# Patient Record
Sex: Female | Born: 1990 | Race: White | Hispanic: No | Marital: Married | Smoking: Never smoker
Health system: Southern US, Community
[De-identification: ages and names within clinical notes are randomized; demographics above are authoritative.]

## PROBLEM LIST (undated history)

## (undated) ENCOUNTER — Inpatient Hospital Stay (HOSPITAL_COMMUNITY): Payer: Self-pay

## (undated) DIAGNOSIS — G43009 Migraine without aura, not intractable, without status migrainosus: Secondary | ICD-10-CM

## (undated) HISTORY — PX: WISDOM TOOTH EXTRACTION: SHX21

## (undated) HISTORY — DX: Migraine without aura, not intractable, without status migrainosus: G43.009

---
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
## 2013-10-14 NOTE — L&D Delivery Note (Signed)
Attestation of Attending Supervision of Advanced Practitioner (CNM/NP): Evaluation and management procedures were performed by the Advanced Practitioner under my supervision and collaboration.  I have reviewed the Advanced Practitioner's note and chart, and I agree with the management and plan.  HARRAWAY-SMITH, Barack Nicodemus 9:02 AM     

## 2013-10-14 NOTE — L&D Delivery Note (Signed)
Delivery Note At 3:14 AM a viable female was delivered via Vaginal, Spontaneous Delivery (Presentation: Left Occiput Anterior).  APGAR: 9, 9; weight .   Placenta status: Intact, Spontaneous.  Cord:  3v with the following complications: None.    Anesthesia: Epidural  Episiotomy: None Lacerations: 1st degree;Labial right Suture Repair: 3.0 vicryl rapide Est. Blood Loss (mL): 100  Mom to postpartum.  Baby to Couplet care / Skin to Skin.  Cord blood collected for donation.  Tawni CarnesWight, Andrew 07/22/2014, 3:49 AM    I was present for the above delivery and agree with the above CRESENZO-DISHMAN,Milee Qualls

## 2013-12-22 ENCOUNTER — Encounter: Payer: Self-pay | Admitting: *Deleted

## 2013-12-22 ENCOUNTER — Ambulatory Visit (INDEPENDENT_AMBULATORY_CARE_PROVIDER_SITE_OTHER): Payer: BC Managed Care – PPO | Admitting: *Deleted

## 2013-12-22 VITALS — BP 129/84 | Ht 67.0 in | Wt 142.0 lb

## 2013-12-22 DIAGNOSIS — Z3401 Encounter for supervision of normal first pregnancy, first trimester: Secondary | ICD-10-CM

## 2013-12-22 DIAGNOSIS — Z3400 Encounter for supervision of normal first pregnancy, unspecified trimester: Secondary | ICD-10-CM

## 2013-12-22 NOTE — Progress Notes (Signed)
P = 77 

## 2013-12-23 LAB — OBSTETRIC PANEL
Antibody Screen: NEGATIVE
BASOS ABS: 0 10*3/uL (ref 0.0–0.1)
BASOS PCT: 0 % (ref 0–1)
Eosinophils Absolute: 0.1 10*3/uL (ref 0.0–0.7)
Eosinophils Relative: 1 % (ref 0–5)
HCT: 37.8 % (ref 36.0–46.0)
HEP B S AG: NEGATIVE
Hemoglobin: 12.8 g/dL (ref 12.0–15.0)
LYMPHS ABS: 1.6 10*3/uL (ref 0.7–4.0)
Lymphocytes Relative: 24 % (ref 12–46)
MCH: 31.1 pg (ref 26.0–34.0)
MCHC: 33.9 g/dL (ref 30.0–36.0)
MCV: 91.7 fL (ref 78.0–100.0)
Monocytes Absolute: 0.6 10*3/uL (ref 0.1–1.0)
Monocytes Relative: 9 % (ref 3–12)
NEUTROS PCT: 66 % (ref 43–77)
Neutro Abs: 4.3 10*3/uL (ref 1.7–7.7)
Platelets: 214 10*3/uL (ref 150–400)
RBC: 4.12 MIL/uL (ref 3.87–5.11)
RDW: 13.3 % (ref 11.5–15.5)
Rh Type: POSITIVE
Rubella: 1.05 Index — ABNORMAL HIGH (ref <0.90)
WBC: 6.5 10*3/uL (ref 4.0–10.5)

## 2013-12-23 LAB — CULTURE, OB URINE
COLONY COUNT: NO GROWTH
ORGANISM ID, BACTERIA: NO GROWTH

## 2013-12-23 LAB — HIV ANTIBODY (ROUTINE TESTING W REFLEX): HIV: NONREACTIVE

## 2013-12-24 LAB — CYSTIC FIBROSIS DIAGNOSTIC STUDY

## 2013-12-28 ENCOUNTER — Encounter: Payer: Self-pay | Admitting: Obstetrics & Gynecology

## 2013-12-28 ENCOUNTER — Ambulatory Visit (INDEPENDENT_AMBULATORY_CARE_PROVIDER_SITE_OTHER): Payer: BC Managed Care – PPO | Admitting: Obstetrics & Gynecology

## 2013-12-28 VITALS — BP 119/76 | Wt 142.0 lb

## 2013-12-28 DIAGNOSIS — Z1151 Encounter for screening for human papillomavirus (HPV): Secondary | ICD-10-CM

## 2013-12-28 DIAGNOSIS — O26851 Spotting complicating pregnancy, first trimester: Secondary | ICD-10-CM

## 2013-12-28 DIAGNOSIS — O26859 Spotting complicating pregnancy, unspecified trimester: Secondary | ICD-10-CM

## 2013-12-28 DIAGNOSIS — Z124 Encounter for screening for malignant neoplasm of cervix: Secondary | ICD-10-CM

## 2013-12-28 DIAGNOSIS — Z3480 Encounter for supervision of other normal pregnancy, unspecified trimester: Secondary | ICD-10-CM

## 2013-12-28 DIAGNOSIS — Z113 Encounter for screening for infections with a predominantly sexual mode of transmission: Secondary | ICD-10-CM

## 2013-12-28 DIAGNOSIS — Z3400 Encounter for supervision of normal first pregnancy, unspecified trimester: Secondary | ICD-10-CM

## 2013-12-28 NOTE — Patient Instructions (Signed)
Pregnancy - First Trimester  During sexual intercourse, millions of sperm go into the vagina. Only 1 sperm will penetrate and fertilize the female egg while it is in the Fallopian tube. One week later, the fertilized egg implants into the wall of the uterus. An embryo begins to develop into a baby. At 6 to 8 weeks, the eyes and face are formed and the heartbeat can be seen on ultrasound. At the end of 12 weeks (first trimester), all the baby's organs are formed. Now that you are pregnant, you will want to do everything you can to have a healthy baby. Two of the most important things are to get good prenatal care and follow your caregiver's instructions. Prenatal care is all the medical care you receive before the baby's birth. It is given to prevent, find, and treat problems during the pregnancy and childbirth.  PRENATAL EXAMS  · During prenatal visits, your weight, blood pressure, and urine are checked. This is done to make sure you are healthy and progressing normally during the pregnancy.  · A pregnant woman should gain 25 to 35 pounds during the pregnancy. However, if you are overweight or underweight, your caregiver will advise you regarding your weight.  · Your caregiver will ask and answer questions for you.  · Blood work, cervical cultures, other necessary tests, and a Pap test are done during your prenatal exams. These tests are done to check on your health and the probable health of your baby. Tests are strongly recommended and done for HIV with your permission. This is the virus that causes AIDS. These tests are done because medicines can be given to help prevent your baby from being born with this infection should you have been infected without knowing it. Blood work is also used to find out your blood type, previous infections, and follow your blood levels (hemoglobin).  · Low hemoglobin (anemia) is common during pregnancy. Iron and vitamins are given to help prevent this. Later in the pregnancy, blood  tests for diabetes will be done along with any other tests if any problems develop.  · You may need other tests to make sure you and the baby are doing well.  CHANGES DURING THE FIRST TRIMESTER   Your body goes through many changes during pregnancy. They vary from person to person. Talk to your caregiver about changes you notice and are concerned about. Changes can include:  · Your menstrual period stops.  · The egg and sperm carry the genes that determine what you look like. Genes from you and your partner are forming a baby. The female genes determine whether the baby is a boy or a girl.  · Your body increases in girth and you may feel bloated.  · Feeling sick to your stomach (nauseous) and throwing up (vomiting). If the vomiting is uncontrollable, call your caregiver.  · Your breasts will begin to enlarge and become tender.  · Your nipples may stick out more and become darker.  · The need to urinate more. Painful urination may mean you have a bladder infection.  · Tiring easily.  · Loss of appetite.  · Cravings for certain kinds of food.  · At first, you may gain or lose a couple of pounds.  · You may have changes in your emotions from day to day (excited to be pregnant or concerned something may go wrong with the pregnancy and baby).  · You may have more vivid and strange dreams.  HOME CARE INSTRUCTIONS   ·   It is very important to avoid all smoking, alcohol and non-prescribed drugs during your pregnancy. These affect the formation and growth of the baby. Avoid chemicals while pregnant to ensure the delivery of a healthy infant.  · Start your prenatal visits by the 12th week of pregnancy. They are usually scheduled monthly at first, then more often in the last 2 months before delivery. Keep your caregiver's appointments. Follow your caregiver's instructions regarding medicine use, blood and lab tests, exercise, and diet.  · During pregnancy, you are providing food for you and your baby. Eat regular, well-balanced  meals. Choose foods such as meat, fish, milk and other low fat dairy products, vegetables, fruits, and whole-grain breads and cereals. Your caregiver will tell you of the ideal weight gain.  · You can help morning sickness by keeping soda crackers at the bedside. Eat a couple before arising in the morning. You may want to use the crackers without salt on them.  · Eating 4 to 5 small meals rather than 3 large meals a day also may help the nausea and vomiting.  · Drinking liquids between meals instead of during meals also seems to help nausea and vomiting.  · A physical sexual relationship may be continued throughout pregnancy if there are no other problems. Problems may be early (premature) leaking of amniotic fluid from the membranes, vaginal bleeding, or belly (abdominal) pain.  · Exercise regularly if there are no restrictions. Check with your caregiver or physical therapist if you are unsure of the safety of some of your exercises. Greater weight gain will occur in the last 2 trimesters of pregnancy. Exercising will help:  · Control your weight.  · Keep you in shape.  · Prepare you for labor and delivery.  · Help you lose your pregnancy weight after you deliver your baby.  · Wear a good support or jogging bra for breast tenderness during pregnancy. This may help if worn during sleep too.  · Ask when prenatal classes are available. Begin classes when they are offered.  · Do not use hot tubs, steam rooms, or saunas.  · Wear your seat belt when driving. This protects you and your baby if you are in an accident.  · Avoid raw meat, uncooked cheese, cat litter boxes, and soil used by cats throughout the pregnancy. These carry germs that can cause birth defects in the baby.  · The first trimester is a good time to visit your dentist for your dental health. Getting your teeth cleaned is okay. Use a softer toothbrush and brush gently during pregnancy.  · Ask for help if you have financial, counseling, or nutritional needs  during pregnancy. Your caregiver will be able to offer counseling for these needs as well as refer you for other special needs.  · Do not take any medicines or herbs unless told by your caregiver.  · Inform your caregiver if there is any mental or physical domestic violence.  · Make a list of emergency phone numbers of family, friends, hospital, and police and fire departments.  · Write down your questions. Take them to your prenatal visit.  · Do not douche.  · Do not cross your legs.  · If you have to stand for long periods of time, rotate you feet or take small steps in a circle.  · You may have more vaginal secretions that may require a sanitary pad. Do not use tampons or scented sanitary pads.  MEDICINES AND DRUG USE IN PREGNANCY  ·   Take prenatal vitamins as directed. The vitamin should contain 1 milligram of folic acid. Keep all vitamins out of reach of children. Only a couple vitamins or tablets containing iron may be fatal to a baby or young child when ingested.  · Avoid use of all medicines, including herbs, over-the-counter medicines, not prescribed or suggested by your caregiver. Only take over-the-counter or prescription medicines for pain, discomfort, or fever as directed by your caregiver. Do not use aspirin, ibuprofen, or naproxen unless directed by your caregiver.  · Let your caregiver also know about herbs you may be using.  · Alcohol is related to a number of birth defects. This includes fetal alcohol syndrome. All alcohol, in any form, should be avoided completely. Smoking will cause low birth rate and premature babies.  · Street or illegal drugs are very harmful to the baby. They are absolutely forbidden. A baby born to an addicted mother will be addicted at birth. The baby will go through the same withdrawal an adult does.  · Let your caregiver know about any medicines that you have to take and for what reason you take them.  SEEK MEDICAL CARE IF:   You have any concerns or worries during your  pregnancy. It is better to call with your questions if you feel they cannot wait, rather than worry about them.  SEEK IMMEDIATE MEDICAL CARE IF:   · An unexplained oral temperature above 102° F (38.9° C) develops, or as your caregiver suggests.  · You have leaking of fluid from the vagina (birth canal). If leaking membranes are suspected, take your temperature and inform your caregiver of this when you call.  · There is vaginal spotting or bleeding. Notify your caregiver of the amount and how many pads are used.  · You develop a bad smelling vaginal discharge with a change in the color.  · You continue to feel sick to your stomach (nauseated) and have no relief from remedies suggested. You vomit blood or coffee ground-like materials.  · You lose more than 2 pounds of weight in 1 week.  · You gain more than 2 pounds of weight in 1 week and you notice swelling of your face, hands, feet, or legs.  · You gain 5 pounds or more in 1 week (even if you do not have swelling of your hands, face, legs, or feet).  · You get exposed to German measles and have never had them.  · You are exposed to fifth disease or chickenpox.  · You develop belly (abdominal) pain. Round ligament discomfort is a common non-cancerous (benign) cause of abdominal pain in pregnancy. Your caregiver still must evaluate this.  · You develop headache, fever, diarrhea, pain with urination, or shortness of breath.  · You fall or are in a car accident or have any kind of trauma.  · There is mental or physical violence in your home.  Document Released: 09/24/2001 Document Revised: 06/24/2012 Document Reviewed: 03/28/2009  ExitCare® Patient Information ©2014 [employer].

## 2013-12-28 NOTE — Progress Notes (Signed)
P:87  Bedside ultrasound shows a CRL of 8wks 5days and a positive fetal heart rate.

## 2013-12-28 NOTE — Progress Notes (Addendum)
Subjective:    Leah Hahn is being seen today for her first obstetrical visit.  This is not a planned pregnancy. She is at 5842w5d gestation.  Relationship with FOB: significant other, living together. Patient does intend to breast feed. Pregnancy history fully reviewed. Pt reports spotting in pregnancy.  No heavy bleeding.  Menstrual History: OB History   Grav Para Term Preterm Abortions TAB SAB Ect Mult Living   1              History reviewed. No pertinent past medical history. Past Surgical History  Procedure Laterality Date  . Knee surgery Left 1992  . Wisdom tooth extraction     No current outpatient prescriptions on file prior to visit.   No current facility-administered medications on file prior to visit.  No Known Allergies     Patient's last menstrual period was 10/16/2013.    The following portions of the patient's history were reviewed and updated as appropriate: allergies, current medications, past family history, past medical history, past social history, past surgical history and problem list.  Review of Systems A comprehensive review of systems was negative.    Objective:    BP 119/76  Wt 142 lb (64.411 kg)  LMP 10/16/2013  General Appearance:    Alert, cooperative, no distress, appears stated age                 Neck:   Supple, symmetrical, trachea midline, no adenopathy;    thyroid:  no enlargement/tenderness/nodules; no carotid   bruit or JVD  Back:     Symmetric, no curvature, ROM normal, no CVA tenderness  Lungs:     Clear to auscultation bilaterally, respirations unlabored  Chest Wall:    No tenderness or deformity   Heart:    Regular rate and rhythm, S1 and S2 normal, no murmur, rub   or gallop  Breast Exam:    No tenderness, masses, or nipple abnormality  Abdomen:     Soft, non-tender, bowel sounds active all four quadrants,    no masses, no organomegaly  Genitalia:    Normal female without lesion, discharge or tenderness; 8 week sized      Extremities:   Extremities normal, atraumatic, no cyanosis or edema; LEFT foot on dorsal surface there is a mole with irreg borders needs furhter eval  Pulses:   2+ and symmetric all extremities  Skin:   Skin color, texture, turgor normal, no rashes or lesions   SONO: IUP noted      Assessment:    Pregnancy at 8 and 5/7 weeks  Spotting in early pregnancy- IUP by sono   Plan:    Initial labs drawn. Prenatal vitamins. Problem list reviewed and updated. Role of ultrasound in pregnancy discussed; fetal survey: requested. Follow up in 4 weeks. 60% of 40 min visit spent on counseling and coordination of care.  F/u with primary care to have lesion on left food evaluated/biopsied

## 2013-12-28 NOTE — Addendum Note (Signed)
Addended by: Tandy GawHINTON, Mishka Stegemann C on: 12/28/2013 01:44 PM   Modules accepted: Orders

## 2014-01-25 ENCOUNTER — Other Ambulatory Visit: Payer: Self-pay | Admitting: Obstetrics & Gynecology

## 2014-01-25 ENCOUNTER — Ambulatory Visit (INDEPENDENT_AMBULATORY_CARE_PROVIDER_SITE_OTHER): Payer: BC Managed Care – PPO | Admitting: Obstetrics & Gynecology

## 2014-01-25 VITALS — BP 101/69 | Wt 137.6 lb

## 2014-01-25 DIAGNOSIS — R1084 Generalized abdominal pain: Secondary | ICD-10-CM

## 2014-01-25 DIAGNOSIS — N390 Urinary tract infection, site not specified: Secondary | ICD-10-CM

## 2014-01-25 DIAGNOSIS — Z3682 Encounter for antenatal screening for nuchal translucency: Secondary | ICD-10-CM

## 2014-01-25 DIAGNOSIS — Z3400 Encounter for supervision of normal first pregnancy, unspecified trimester: Secondary | ICD-10-CM

## 2014-01-25 DIAGNOSIS — Z3480 Encounter for supervision of other normal pregnancy, unspecified trimester: Secondary | ICD-10-CM

## 2014-01-25 MED ORDER — NITROFURANTOIN MONOHYD MACRO 100 MG PO CAPS: 100.0000 mg | ORAL_CAPSULE | Freq: Two times a day (BID) | ORAL | Status: DC

## 2014-01-25 NOTE — Progress Notes (Signed)
P- 78  Increase in nausea & vomiting.  Large blood, moderate leukocytes in urine.

## 2014-01-25 NOTE — Addendum Note (Signed)
Addended by: Pennie BanterSMITH, Kevaughn Ewing W on: 01/25/2014 09:22 AM   Modules accepted: Orders

## 2014-01-25 NOTE — Progress Notes (Signed)
Routine visit. Some nausea, will take Vit B 6. She and FOB do want the First screen. This will be scheduled asap. She has +leuks and blood on u/a and has UTI symptoms. Script for [employer] sent. Check uc&s.

## 2014-01-26 ENCOUNTER — Other Ambulatory Visit: Payer: Self-pay

## 2014-01-26 ENCOUNTER — Ambulatory Visit (HOSPITAL_COMMUNITY)
Admission: RE | Admit: 2014-01-26 | Discharge: 2014-01-26 | Disposition: A | Discharge status: Home / Self Care | Payer: BC Managed Care – PPO | Source: Ambulatory Visit | Attending: Obstetrics & Gynecology | Admitting: Obstetrics & Gynecology

## 2014-01-26 ENCOUNTER — Other Ambulatory Visit (HOSPITAL_COMMUNITY): Payer: Self-pay | Admitting: Obstetrics and Gynecology

## 2014-01-26 VITALS — BP 110/62 | HR 80 | Wt 143.0 lb

## 2014-01-26 DIAGNOSIS — Z3689 Encounter for other specified antenatal screening: Secondary | ICD-10-CM | POA: Diagnosis not present

## 2014-01-26 DIAGNOSIS — O351XX0 Maternal care for (suspected) chromosomal abnormality in fetus, not applicable or unspecified: Secondary | ICD-10-CM | POA: Insufficient documentation

## 2014-01-26 DIAGNOSIS — Z3480 Encounter for supervision of other normal pregnancy, unspecified trimester: Secondary | ICD-10-CM

## 2014-01-26 DIAGNOSIS — O3510X0 Maternal care for (suspected) chromosomal abnormality in fetus, unspecified, not applicable or unspecified: Secondary | ICD-10-CM | POA: Insufficient documentation

## 2014-01-26 DIAGNOSIS — Z3682 Encounter for antenatal screening for nuchal translucency: Secondary | ICD-10-CM

## 2014-01-26 DIAGNOSIS — IMO0002 Reserved for concepts with insufficient information to code with codable children: Secondary | ICD-10-CM

## 2014-01-26 DIAGNOSIS — Z0489 Encounter for examination and observation for other specified reasons: Secondary | ICD-10-CM

## 2014-01-27 ENCOUNTER — Telehealth: Payer: Self-pay | Admitting: *Deleted

## 2014-01-27 DIAGNOSIS — O219 Vomiting of pregnancy, unspecified: Secondary | ICD-10-CM

## 2014-01-27 LAB — CULTURE, OB URINE

## 2014-01-27 MED ORDER — PROMETHAZINE HCL 25 MG PO TABS: 25.0000 mg | ORAL_TABLET | Freq: Four times a day (QID) | ORAL | Status: DC | PRN

## 2014-01-27 NOTE — Telephone Encounter (Signed)
Patient was offered a prescription for phenergan at her visit and declined at that time but now would like to have that called in for her.  She understands that it might make her sleepy and will be cautious until she figures out how she responds to the medication.

## 2014-01-28 ENCOUNTER — Encounter: Payer: Self-pay | Admitting: Obstetrics & Gynecology

## 2014-01-28 ENCOUNTER — Telehealth: Payer: Self-pay | Admitting: *Deleted

## 2014-01-28 DIAGNOSIS — O2340 Unspecified infection of urinary tract in pregnancy, unspecified trimester: Secondary | ICD-10-CM

## 2014-01-28 DIAGNOSIS — N390 Urinary tract infection, site not specified: Secondary | ICD-10-CM

## 2014-01-28 DIAGNOSIS — B951 Streptococcus, group B, as the cause of diseases classified elsewhere: Secondary | ICD-10-CM | POA: Insufficient documentation

## 2014-01-28 MED ORDER — AMPICILLIN 500 MG PO CAPS: 500.0000 mg | ORAL_CAPSULE | Freq: Four times a day (QID) | ORAL | Status: DC

## 2014-01-28 NOTE — Telephone Encounter (Signed)
Message copied by Barbara CowerNOGUES, Tige Meas L on Fri Jan 28, 2014 11:50 AM ------      Message from: Allie BossierVE, MYRA C      Created: Fri Jan 28, 2014 10:42 AM       She will need a prescription for amp 500 mg po TID for 7 days.      Thanks ------

## 2014-02-03 ENCOUNTER — Encounter: Payer: Self-pay | Admitting: Obstetrics & Gynecology

## 2014-02-03 ENCOUNTER — Ambulatory Visit (INDEPENDENT_AMBULATORY_CARE_PROVIDER_SITE_OTHER): Payer: BC Managed Care – PPO | Admitting: Obstetrics & Gynecology

## 2014-02-03 VITALS — BP 100/66 | HR 66 | Wt 138.0 lb

## 2014-02-03 DIAGNOSIS — O26859 Spotting complicating pregnancy, unspecified trimester: Secondary | ICD-10-CM

## 2014-02-03 DIAGNOSIS — Z3400 Encounter for supervision of normal first pregnancy, unspecified trimester: Secondary | ICD-10-CM

## 2014-02-03 DIAGNOSIS — Z3480 Encounter for supervision of other normal pregnancy, unspecified trimester: Secondary | ICD-10-CM

## 2014-02-03 DIAGNOSIS — R300 Dysuria: Secondary | ICD-10-CM

## 2014-02-03 LAB — POCT URINALYSIS DIPSTICK
Bilirubin, UA: NEGATIVE
Blood, UA: NEGATIVE
Glucose, UA: NEGATIVE
Ketones, UA: NEGATIVE
Leukocytes, UA: NEGATIVE
Nitrite, UA: NEGATIVE
Protein, UA: NEGATIVE
Spec Grav, UA: 1.015
Urobilinogen, UA: NEGATIVE
pH, UA: 8.5

## 2014-02-03 NOTE — Progress Notes (Signed)
Work in visit. She had some small amount of BRVB last night. No other problems. NT normal. Cervix is closed visually and has no visible blood. Reassurance given. Rec pelvic rest. MSAFP at next visit.

## 2014-02-06 LAB — CULTURE, OB URINE: Colony Count: 70000

## 2014-02-15 ENCOUNTER — Telehealth: Payer: Self-pay | Admitting: *Deleted

## 2014-02-15 DIAGNOSIS — N390 Urinary tract infection, site not specified: Secondary | ICD-10-CM

## 2014-02-15 MED ORDER — CEPHALEXIN 500 MG PO CAPS: 500.0000 mg | ORAL_CAPSULE | Freq: Three times a day (TID) | ORAL | Status: DC

## 2014-02-15 NOTE — Telephone Encounter (Signed)
Pt urine culture showed positive for a UTI.  I have sent in Keflex to pt pharmacy.  Pt aware.

## 2014-02-22 ENCOUNTER — Encounter: Payer: BC Managed Care – PPO | Admitting: Obstetrics & Gynecology

## 2014-03-01 ENCOUNTER — Encounter: Payer: BC Managed Care – PPO | Admitting: Obstetrics & Gynecology

## 2014-03-01 ENCOUNTER — Encounter: Payer: Self-pay | Admitting: Nurse Practitioner

## 2014-03-01 ENCOUNTER — Ambulatory Visit (INDEPENDENT_AMBULATORY_CARE_PROVIDER_SITE_OTHER): Payer: BC Managed Care – PPO | Admitting: Nurse Practitioner

## 2014-03-01 ENCOUNTER — Ambulatory Visit (INDEPENDENT_AMBULATORY_CARE_PROVIDER_SITE_OTHER): Payer: BC Managed Care – PPO | Admitting: Obstetrics & Gynecology

## 2014-03-01 VITALS — BP 100/68 | HR 91 | Wt 137.6 lb

## 2014-03-01 DIAGNOSIS — F419 Anxiety disorder, unspecified: Secondary | ICD-10-CM | POA: Insufficient documentation

## 2014-03-01 DIAGNOSIS — G43009 Migraine without aura, not intractable, without status migrainosus: Secondary | ICD-10-CM

## 2014-03-01 DIAGNOSIS — G4700 Insomnia, unspecified: Secondary | ICD-10-CM

## 2014-03-01 DIAGNOSIS — F411 Generalized anxiety disorder: Secondary | ICD-10-CM

## 2014-03-01 DIAGNOSIS — Z3480 Encounter for supervision of other normal pregnancy, unspecified trimester: Secondary | ICD-10-CM

## 2014-03-01 HISTORY — DX: Migraine without aura, not intractable, without status migrainosus: G43.009

## 2014-03-01 MED ORDER — SUMATRIPTAN SUCCINATE 100 MG PO TABS: 100.0000 mg | ORAL_TABLET | Freq: Once | ORAL | Status: DC | PRN

## 2014-03-01 MED ORDER — CYCLOBENZAPRINE HCL 10 MG PO TABS: 10.0000 mg | ORAL_TABLET | Freq: Two times a day (BID) | ORAL | Status: DC | PRN

## 2014-03-01 NOTE — Patient Instructions (Signed)
Return to clinic for any obstetric concerns or go to [facility] for evaluation  

## 2014-03-01 NOTE — Patient Instructions (Signed)
Migraine Headache A migraine headache is an intense, throbbing pain on one or both sides of your head. A migraine can last for 30 minutes to several hours. CAUSES  The exact cause of a migraine headache is not always known. However, a migraine may be caused when nerves in the brain become irritated and release chemicals that cause inflammation. This causes pain. Certain things may also trigger migraines, such as:  Alcohol.  Smoking.  Stress.  Menstruation.  Aged cheeses.  Foods or drinks that contain nitrates, glutamate, aspartame, or tyramine.  Lack of sleep.  Chocolate.  Caffeine.  Hunger.  Physical exertion.  Fatigue.  Medicines used to treat chest pain (nitroglycerine), birth control pills, estrogen, and some blood pressure medicines. SIGNS AND SYMPTOMS  Pain on one or both sides of your head.  Pulsating or throbbing pain.  Severe pain that prevents daily activities.  Pain that is aggravated by any physical activity.  Nausea, vomiting, or both.  Dizziness.  Pain with exposure to bright lights, loud noises, or activity.  General sensitivity to bright lights, loud noises, or smells. Before you get a migraine, you may get warning signs that a migraine is coming (aura). An aura may include:  Seeing flashing lights.  Seeing bright spots, halos, or zig-zag lines.  Having tunnel vision or blurred vision.  Having feelings of numbness or tingling.  Having trouble talking.  Having muscle weakness. DIAGNOSIS  A migraine headache is often diagnosed based on:  Symptoms.  Physical exam.  A CT scan or MRI of your head. These imaging tests cannot diagnose migraines, but they can help rule out other causes of headaches. TREATMENT Medicines may be given for pain and nausea. Medicines can also be given to help prevent recurrent migraines.  HOME CARE INSTRUCTIONS  Only take over-the-counter or prescription medicines for pain or discomfort as directed by your  health care provider. The use of long-term narcotics is not recommended.  Lie down in a dark, quiet room when you have a migraine.  Keep a journal to find out what may trigger your migraine headaches. For example, write down:  What you eat and drink.  How much sleep you get.  Any change to your diet or medicines.  Limit alcohol consumption.  Quit smoking if you smoke.  Get 7 9 hours of sleep, or as recommended by your health care provider.  Limit stress.  Keep lights dim if bright lights bother you and make your migraines worse. SEEK IMMEDIATE MEDICAL CARE IF:   Your migraine becomes severe.  You have a fever.  You have a stiff neck.  You have vision loss.  You have muscular weakness or loss of muscle control.  You start losing your balance or have trouble walking.  You feel faint or pass out.  You have severe symptoms that are different from your first symptoms. MAKE SURE YOU:   Understand these instructions.  Will watch your condition.  Will get help right away if you are not doing well or get worse. Document Released: 09/30/2005 Document Revised: 07/21/2013 Document Reviewed: 06/07/2013 ExitCare Patient Information 2014 [employer].  

## 2014-03-01 NOTE — Progress Notes (Signed)
Patient found some family history that she wanted to make us aware of including father of the baby's brother has ectodermal dysplasia (sweat glands are non existent and no teeth on lower jaw).  Also patients father had Pectus excavatum where his chest bones grew inward and had to be surgically corrected.  She is also seeing Bonita QuinLinda today for her Migraines.

## 2014-03-01 NOTE — Progress Notes (Signed)
Diagnosis:Migraine without aura, anxiety, insomnia  History: Leah ChristenMichelle Channing Hahn 23 y.o. 6876w5d G1P0 [facility] in pregnancy. She has a long history of migraine that she remembers back to 2nd grade. Her mother, father and grandmother have migraine. She does not have aura. Her migraines are worse at this time related to pregnancy. She has been hesitant to take any medications other than tylenol. She has some difficulty with anxiety and depression. She was on Lexapro from 5th grade to college. She also has problems with sleep falling asleep and staying asleep. She is up every hour now. She has never had her migraines evaluated or treated and she has never used triptans. She is engaged to be married next September. This was an unplanned pregnancy. 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  Location: left or right temple  Number of Headache days/month: Severe:2 Moderate:15 Mild:5  Current Outpatient Prescriptions on File Prior to Visit  Medication Sig Dispense Refill  . ampicillin (PRINCIPEN) 500 MG capsule Take 1 capsule (500 mg total) by mouth 4 (four) times daily.  21 capsule  0  . cephALEXin (KEFLEX) 500 MG capsule Take 1 capsule (500 mg total) by mouth 3 (three) times daily.  21 capsule  0  . Prenatal Multivit-Min-Fe-FA (PRENATAL VITAMINS PO) Take by mouth.      . promethazine (PHENERGAN) 25 MG tablet Take 1 tablet (25 mg total) by mouth every 6 (six) hours as needed for nausea or vomiting.  30 tablet  1   No current facility-administered medications on file prior to visit.    Acute/ prevention: tylenol, NSAIDS  No past medical history on file. Past Surgical History  Procedure Laterality Date  . Knee surgery Left 1992  . Wisdom tooth extraction     Family History  Problem Relation Age of Onset  . Anemia Mother   . Anemia Maternal Grandfather   . Cancer Maternal Grandfather     COLON/LIVER   Social History:  reports that she has  never smoked. She has never used smokeless tobacco. She reports that she does not drink alcohol or use illicit drugs. Allergies: No Known Allergies  Triggers: stress, not sleeping well  Birth control: pregnant  ROS: Positive for pregnancy, migraine, anxiety, hx depression, insomnia, negative for allergies, asthma, htn, cardiac disorders  Exam: Well developed, well nourished, caucasian female  General:NAD HEENT:Negative Cardiac:RRR Lungs:Clear Neuro:Negative Skin:Warm and dry  Impression:migraine - common  Plan: Discussed the pathophysiology of migraine and pregnancy. She is aware there is a risk / benefit ratio with all medications and she is willing to assume the risks. To help her relax and sleep we will use Flexeril. For an actual migraine she will use Imitrex. She is encouraged to break phenergan in half and try it again for nausea. We also discussed nonmedication management of migraines including mild exercises yoga and water aerobics. She will return prn or if these measures do not help her.   Time Spent: 45 minutes

## 2014-03-01 NOTE — Progress Notes (Signed)
The family conditions reported by patient (ectodermal dysplasia, pectus excavatum) are rare disorders and there is no pertinent prenatal testing.  This was discussed with patient. Normal first screen, AFP ordered for today. Anatomy scan already scheduled at [facility]. No other complaints or concerns.  Routine obstetric precautions reviewed.

## 2014-03-02 LAB — ALPHA FETOPROTEIN, MATERNAL
AFP: 53.2 IU/mL
CURR GEST AGE: 17.5 wks.days
MoM for AFP: 1.32
Open Spina bifida: NEGATIVE
Osb Risk: 1:4720 {titer}

## 2014-03-09 ENCOUNTER — Ambulatory Visit (HOSPITAL_COMMUNITY)
Admission: RE | Admit: 2014-03-09 | Discharge: 2014-03-09 | Disposition: A | Discharge status: Home / Self Care | Payer: BC Managed Care – PPO | Source: Ambulatory Visit | Attending: Obstetrics & Gynecology | Admitting: Obstetrics & Gynecology

## 2014-03-09 ENCOUNTER — Other Ambulatory Visit (HOSPITAL_COMMUNITY): Payer: Self-pay | Admitting: Obstetrics and Gynecology

## 2014-03-09 DIAGNOSIS — Z363 Encounter for antenatal screening for malformations: Secondary | ICD-10-CM | POA: Insufficient documentation

## 2014-03-09 DIAGNOSIS — Z0489 Encounter for examination and observation for other specified reasons: Secondary | ICD-10-CM

## 2014-03-09 DIAGNOSIS — IMO0002 Reserved for concepts with insufficient information to code with codable children: Secondary | ICD-10-CM

## 2014-03-09 DIAGNOSIS — Z1389 Encounter for screening for other disorder: Secondary | ICD-10-CM | POA: Insufficient documentation

## 2014-03-10 ENCOUNTER — Other Ambulatory Visit: Payer: BC Managed Care – PPO | Admitting: *Deleted

## 2014-03-10 NOTE — Progress Notes (Signed)
Patient fell at work yesterday and just wanted to come to see and hear the babies heartbeat for reassurance.  Heartbeat is 160bpm.  Baby is active and patient feels him moving around.  She is not having any bleeding or cramping just lower back pain from where she fell.

## 2014-03-29 ENCOUNTER — Encounter: Payer: Self-pay | Admitting: Family Medicine

## 2014-03-29 ENCOUNTER — Ambulatory Visit (INDEPENDENT_AMBULATORY_CARE_PROVIDER_SITE_OTHER): Payer: BC Managed Care – PPO | Admitting: Family Medicine

## 2014-03-29 VITALS — BP 105/72 | HR 80 | Wt 142.0 lb

## 2014-03-29 DIAGNOSIS — Z3480 Encounter for supervision of other normal pregnancy, unspecified trimester: Secondary | ICD-10-CM

## 2014-03-29 NOTE — Patient Instructions (Signed)
Second Trimester of Pregnancy The second trimester is from week 13 through week 28, months 4 through 6. The second trimester is often a time when you feel your best. Your body has also adjusted to being pregnant, and you begin to feel better physically. Usually, morning sickness has lessened or quit completely, you may have more energy, and you may have an increase in appetite. The second trimester is also a time when the fetus is growing rapidly. At the end of the sixth month, the fetus is about 9 inches long and weighs about 1 pounds. You will likely begin to feel the baby move (quickening) between 18 and 20 weeks of the pregnancy. BODY CHANGES Your body goes through many changes during pregnancy. The changes vary from woman to woman.   Your weight will continue to increase. You will notice your lower abdomen bulging out.  You may begin to get stretch marks on your hips, abdomen, and breasts.  You may develop headaches that can be relieved by medicines approved by your caregiver.  You may urinate more often because the fetus is pressing on your bladder.  You may develop or continue to have heartburn as a result of your pregnancy.  You may develop constipation because certain hormones are causing the muscles that push waste through your intestines to slow down.  You may develop hemorrhoids or swollen, bulging veins (varicose veins).  You may have back pain because of the weight gain and pregnancy hormones relaxing your joints between the bones in your pelvis and as a result of a shift in weight and the muscles that support your balance.  Your breasts will continue to grow and be tender.  Your gums may bleed and may be sensitive to brushing and flossing.  Dark spots or blotches (chloasma, mask of pregnancy) may develop on your face. This will likely fade after the baby is born.  A dark line from your belly button to the pubic area (linea nigra) may appear. This will likely fade after  the baby is born. WHAT TO EXPECT AT YOUR PRENATAL VISITS During a routine prenatal visit:  You will be weighed to make sure you and the fetus are growing normally.  Your blood pressure will be taken.  Your abdomen will be measured to track your baby's growth.  The fetal heartbeat will be listened to.  Any test results from the previous visit will be discussed. Your caregiver may ask you:  How you are feeling.  If you are feeling the baby move.  If you have had any abnormal symptoms, such as leaking fluid, bleeding, severe headaches, or abdominal cramping.  If you have any questions. Other tests that may be performed during your second trimester include:  Blood tests that check for:  Low iron levels (anemia).  Gestational diabetes (between 24 and 28 weeks).  Rh antibodies.  Urine tests to check for infections, diabetes, or protein in the urine.  An ultrasound to confirm the proper growth and development of the baby.  An amniocentesis to check for possible genetic problems.  Fetal screens for spina bifida and Down syndrome. HOME CARE INSTRUCTIONS   Avoid all smoking, herbs, alcohol, and unprescribed drugs. These chemicals affect the formation and growth of the baby.  Follow your caregiver's instructions regarding medicine use. There are medicines that are either safe or unsafe to take during pregnancy.  Exercise only as directed by your caregiver. Experiencing uterine cramps is a good sign to stop exercising.  Continue to eat regular,   healthy meals.  Wear a good support bra for breast tenderness.  Do not use hot tubs, steam rooms, or saunas.  Wear your seat belt at all times when driving.  Avoid raw meat, uncooked cheese, cat litter boxes, and soil used by cats. These carry germs that can cause birth defects in the baby.  Take your prenatal vitamins.  Try taking a stool softener (if your caregiver approves) if you develop constipation. Eat more high-fiber  foods, such as fresh vegetables or fruit and whole grains. Drink plenty of fluids to keep your urine clear or pale yellow.  Take warm sitz baths to soothe any pain or discomfort caused by hemorrhoids. Use hemorrhoid cream if your caregiver approves.  If you develop varicose veins, wear support hose. Elevate your feet for 15 minutes, 3 4 times a day. Limit salt in your diet.  Avoid heavy lifting, wear low heel shoes, and practice good posture.  Rest with your legs elevated if you have leg cramps or low back pain.  Visit your dentist if you have not gone yet during your pregnancy. Use a soft toothbrush to brush your teeth and be gentle when you floss.  A sexual relationship may be continued unless your caregiver directs you otherwise.  Continue to go to all your prenatal visits as directed by your caregiver. SEEK MEDICAL CARE IF:   You have dizziness.  You have mild pelvic cramps, pelvic pressure, or nagging pain in the abdominal area.  You have persistent nausea, vomiting, or diarrhea.  You have a bad smelling vaginal discharge.  You have pain with urination. SEEK IMMEDIATE MEDICAL CARE IF:   You have a fever.  You are leaking fluid from your vagina.  You have spotting or bleeding from your vagina.  You have severe abdominal cramping or pain.  You have rapid weight gain or loss.  You have shortness of breath with chest pain.  You notice sudden or extreme swelling of your face, hands, ankles, feet, or legs.  You have not felt your baby move in over an hour.  You have severe headaches that do not go away with medicine.  You have vision changes. Document Released: 09/24/2001 Document Revised: 06/02/2013 Document Reviewed: 12/01/2012 [employer] Patient Information 2014 [employer].  Breastfeeding Deciding to breastfeed is one of the best choices you can make for you and your baby. A change in hormones during pregnancy causes your breast tissue to grow and increases the  number and size of your milk ducts. These hormones also allow proteins, sugars, and fats from your blood supply to make breast milk in your milk-producing glands. Hormones prevent breast milk from being released before your baby is born as well as prompt milk flow after birth. Once breastfeeding has begun, thoughts of your baby, as well as his or her sucking or crying, can stimulate the release of milk from your milk-producing glands.  BENEFITS OF BREASTFEEDING For Your Baby  Your first milk (colostrum) helps your baby's digestive system function better.   There are antibodies in your milk that help your baby fight off infections.   Your baby has a lower incidence of asthma, allergies, and sudden infant death syndrome.   The nutrients in breast milk are better for your baby than infant formulas and are designed uniquely for your baby's needs.   Breast milk improves your baby's brain development.   Your baby is less likely to develop other conditions, such as childhood obesity, asthma, or type 2 diabetes mellitus.  For   You   Breastfeeding helps to create a very special bond between you and your baby.   Breastfeeding is convenient. Breast milk is always available at the correct temperature and costs nothing.   Breastfeeding helps to burn calories and helps you lose the weight gained during pregnancy.   Breastfeeding makes your uterus contract to its prepregnancy size faster and slows bleeding (lochia) after you give birth.   Breastfeeding helps to lower your risk of developing type 2 diabetes mellitus, osteoporosis, and breast or ovarian cancer later in life. SIGNS THAT YOUR BABY IS HUNGRY Early Signs of Hunger  Increased alertness or activity.  Stretching.  Movement of the head from side to side.  Movement of the head and opening of the mouth when the corner of the mouth or cheek is stroked (rooting).  Increased sucking sounds, smacking lips, cooing, sighing, or  squeaking.  Hand-to-mouth movements.  Increased sucking of fingers or hands. Late Signs of Hunger  Fussing.  Intermittent crying. Extreme Signs of Hunger Signs of extreme hunger will require calming and consoling before your baby will be able to breastfeed successfully. Do not wait for the following signs of extreme hunger to occur before you initiate breastfeeding:   Restlessness.  A loud, strong cry.   Screaming. BREASTFEEDING BASICS Breastfeeding Initiation  Find a comfortable place to sit or lie down, with your neck and back well supported.  Place a pillow or rolled up blanket under your baby to bring him or her to the level of your breast (if you are seated). Nursing pillows are specially designed to help support your arms and your baby while you breastfeed.  Make sure that your baby's abdomen is facing your abdomen.   Gently massage your breast. With your fingertips, massage from your chest wall toward your nipple in a circular motion. This encourages milk flow. You may need to continue this action during the feeding if your milk flows slowly.  Support your breast with 4 fingers underneath and your thumb above your nipple. Make sure your fingers are well away from your nipple and your baby's mouth.   Stroke your baby's lips gently with your finger or nipple.   When your baby's mouth is open wide enough, quickly bring your baby to your breast, placing your entire nipple and as much of the colored area around your nipple (areola) as possible into your baby's mouth.   More areola should be visible above your baby's upper lip than below the lower lip.   Your baby's tongue should be between his or her lower gum and your breast.   Ensure that your baby's mouth is correctly positioned around your nipple (latched). Your baby's lips should create a seal on your breast and be turned out (everted).  It is common for your baby to suck about 2 3 minutes in order to start the  flow of breast milk. Latching Teaching your baby how to latch on to your breast properly is very important. An improper latch can cause nipple pain and decreased milk supply for you and poor weight gain in your baby. Also, if your baby is not latched onto your nipple properly, he or she may swallow some air during feeding. This can make your baby fussy. Burping your baby when you switch breasts during the feeding can help to get rid of the air. However, teaching your baby to latch on properly is still the best way to prevent fussiness from swallowing air while breastfeeding. Signs that your baby has   successfully latched on to your nipple:    Silent tugging or silent sucking, without causing you pain.   Swallowing heard between every 3 4 sucks.    Muscle movement above and in front of his or her ears while sucking.  Signs that your baby has not successfully latched on to nipple:   Sucking sounds or smacking sounds from your baby while breastfeeding.  Nipple pain. If you think your baby has not latched on correctly, slip your finger into the corner of your baby's mouth to break the suction and place it between your baby's gums. Attempt breastfeeding initiation again. Signs of Successful Breastfeeding Signs from your baby:   A gradual decrease in the number of sucks or complete cessation of sucking.   Falling asleep.   Relaxation of his or her body.   Retention of a small amount of milk in his or her mouth.   Letting go of your breast by himself or herself. Signs from you:  Breasts that have increased in firmness, weight, and size 1 3 hours after feeding.   Breasts that are softer immediately after breastfeeding.  Increased milk volume, as well as a change in milk consistency and color by the 5th day of breastfeeding.   Nipples that are not sore, cracked, or bleeding. Signs That Your Baby is Getting Enough Milk  Wetting at least 3 diapers in a 24-hour period. The urine  should be clear and pale yellow by age 5 days.  At least 3 stools in a 24-hour period by age 5 days. The stool should be soft and yellow.  At least 3 stools in a 24-hour period by age 7 days. The stool should be seedy and yellow.  No loss of weight greater than 10% of birth weight during the first 3 days of age.  Average weight gain of 4 7 ounces (120 210 mL) per week after age 4 days.  Consistent daily weight gain by age 5 days, without weight loss after the age of 2 weeks. After a feeding, your baby may spit up a small amount. This is common. BREASTFEEDING FREQUENCY AND DURATION Frequent feeding will help you make more milk and can prevent sore nipples and breast engorgement. Breastfeed when you feel the need to reduce the fullness of your breasts or when your baby shows signs of hunger. This is called "breastfeeding on demand." Avoid introducing a pacifier to your baby while you are working to establish breastfeeding (the first 4 6 weeks after your baby is born). After this time you may choose to use a pacifier. Research has shown that pacifier use during the first year of a baby's life decreases the risk of sudden infant death syndrome (SIDS). Allow your baby to feed on each breast as long as he or she wants. Breastfeed until your baby is finished feeding. When your baby unlatches or falls asleep while feeding from the first breast, offer the second breast. Because newborns are often sleepy in the first few weeks of life, you may need to awaken your baby to get him or her to feed. Breastfeeding times will vary from baby to baby. However, the following rules can serve as a guide to help you ensure that your baby is properly fed:  Newborns (babies 4 weeks of age or younger) may breastfeed every 1 3 hours.  Newborns should not go longer than 3 hours during the day or 5 hours during the night without breastfeeding.  You should breastfeed your baby a minimum of   8 times in a 24-hour period until  you begin to introduce solid foods to your baby at around 6 months of age. BREAST MILK PUMPING Pumping and storing breast milk allows you to ensure that your baby is exclusively fed your breast milk, even at times when you are unable to breastfeed. This is especially important if you are going back to work while you are still breastfeeding or when you are not able to be present during feedings. Your lactation consultant can give you guidelines on how long it is safe to store breast milk.  A breast pump is a machine that allows you to pump milk from your breast into a sterile bottle. The pumped breast milk can then be stored in a refrigerator or freezer. Some breast pumps are operated by hand, while others use electricity. Ask your lactation consultant which type will work best for you. Breast pumps can be purchased, but some hospitals and breastfeeding support groups lease breast pumps on a monthly basis. A lactation consultant can teach you how to hand express breast milk, if you prefer not to use a pump.  CARING FOR YOUR BREASTS WHILE YOU BREASTFEED Nipples can become dry, cracked, and sore while breastfeeding. The following recommendations can help keep your breasts moisturized and healthy:  Avoid using soap on your nipples.   Wear a supportive bra. Although not required, special nursing bras and tank tops are designed to allow access to your breasts for breastfeeding without taking off your entire bra or top. Avoid wearing underwire style bras or extremely tight bras.  Air dry your nipples for 3 4minutes after each feeding.   Use only cotton bra pads to absorb leaked breast milk. Leaking of breast milk between feedings is normal.   Use lanolin on your nipples after breastfeeding. Lanolin helps to maintain your skin's normal moisture barrier. If you use pure lanolin you do not need to wash it off before feeding your baby again. Pure lanolin is not toxic to your baby. You may also hand express a  few drops of breast milk and gently massage that milk into your nipples and allow the milk to air dry. In the first few weeks after giving birth, some women experience extremely full breasts (engorgement). Engorgement can make your breasts feel heavy, warm, and tender to the touch. Engorgement peaks within 3 5 days after you give birth. The following recommendations can help ease engorgement:  Completely empty your breasts while breastfeeding or pumping. You may want to start by applying warm, moist heat (in the shower or with warm water-soaked hand towels) just before feeding or pumping. This increases circulation and helps the milk flow. If your baby does not completely empty your breasts while breastfeeding, pump any extra milk after he or she is finished.  Wear a snug bra (nursing or regular) or tank top for 1 2 days to signal your body to slightly decrease milk production.  Apply ice packs to your breasts, unless this is too uncomfortable for you.  Make sure that your baby is latched on and positioned properly while breastfeeding. If engorgement persists after 48 hours of following these recommendations, contact your health care Marwan Lipe or a lactation consultant. OVERALL HEALTH CARE RECOMMENDATIONS WHILE BREASTFEEDING  Eat healthy foods. Alternate between meals and snacks, eating 3 of each per day. Because what you eat affects your breast milk, some of the foods may make your baby more irritable than usual. Avoid eating these foods if you are sure that they are   negatively affecting your baby.  Drink milk, fruit juice, and water to satisfy your thirst (about 10 glasses a day).   Rest often, relax, and continue to take your prenatal vitamins to prevent fatigue, stress, and anemia.  Continue breast self-awareness checks.  Avoid chewing and smoking tobacco.  Avoid alcohol and drug use. Some medicines that may be harmful to your baby can pass through breast milk. It is important to ask your  health care Jashon Ishida before taking any medicine, including all over-the-counter and prescription medicine as well as vitamin and herbal supplements. It is possible to become pregnant while breastfeeding. If birth control is desired, ask your health care Charmin Aguiniga about options that will be safe for your baby. SEEK MEDICAL CARE IF:   You feel like you want to stop breastfeeding or have become frustrated with breastfeeding.  You have painful breasts or nipples.  Your nipples are cracked or bleeding.  Your breasts are red, tender, or warm.  You have a swollen area on either breast.  You have a fever or chills.  You have nausea or vomiting.  You have drainage other than breast milk from your nipples.  Your breasts do not become full before feedings by the 5th day after you give birth.  You feel sad and depressed.  Your baby is too sleepy to eat well.  Your baby is having trouble sleeping.   Your baby is wetting less than 3 diapers in a 24-hour period.  Your baby has less than 3 stools in a 24-hour period.  Your baby's skin or the white part of his or her eyes becomes yellow.   Your baby is not gaining weight by 5 days of age. SEEK IMMEDIATE MEDICAL CARE IF:   Your baby is overly tired (lethargic) and does not want to wake up and feed.  Your baby develops an unexplained fever. Document Released: 09/30/2005 Document Revised: 06/02/2013 Document Reviewed: 03/24/2013 [employer] Patient Information 2014 [employer].  

## 2014-03-29 NOTE — Progress Notes (Signed)
Normal anatomy Normal AFP Good fetal movement

## 2014-04-26 ENCOUNTER — Encounter: Payer: Self-pay | Admitting: Obstetrics & Gynecology

## 2014-04-26 ENCOUNTER — Ambulatory Visit (INDEPENDENT_AMBULATORY_CARE_PROVIDER_SITE_OTHER): Payer: BC Managed Care – PPO | Admitting: Obstetrics & Gynecology

## 2014-04-26 VITALS — BP 106/73 | HR 81 | Wt 146.0 lb

## 2014-04-26 DIAGNOSIS — Z3480 Encounter for supervision of other normal pregnancy, unspecified trimester: Secondary | ICD-10-CM

## 2014-04-26 DIAGNOSIS — Z3482 Encounter for supervision of other normal pregnancy, second trimester: Secondary | ICD-10-CM

## 2014-04-26 NOTE — Progress Notes (Signed)
Routine visit. Good FM. No problems. Glucola/labs/tpad at [facility].

## 2014-05-17 ENCOUNTER — Ambulatory Visit (INDEPENDENT_AMBULATORY_CARE_PROVIDER_SITE_OTHER): Payer: BC Managed Care – PPO | Admitting: Family Medicine

## 2014-05-17 ENCOUNTER — Encounter: Payer: Self-pay | Admitting: Family Medicine

## 2014-05-17 VITALS — BP 115/77 | HR 72 | Wt 147.0 lb

## 2014-05-17 DIAGNOSIS — Z3483 Encounter for supervision of other normal pregnancy, third trimester: Secondary | ICD-10-CM

## 2014-05-17 DIAGNOSIS — Z3480 Encounter for supervision of other normal pregnancy, unspecified trimester: Secondary | ICD-10-CM

## 2014-05-17 DIAGNOSIS — Z23 Encounter for immunization: Secondary | ICD-10-CM

## 2014-05-17 LAB — CBC
HEMATOCRIT: 36.7 % (ref 36.0–46.0)
Hemoglobin: 12.3 g/dL (ref 12.0–15.0)
MCH: 31.4 pg (ref 26.0–34.0)
MCHC: 33.5 g/dL (ref 30.0–36.0)
MCV: 93.6 fL (ref 78.0–100.0)
Platelets: 216 10*3/uL (ref 150–400)
RBC: 3.92 MIL/uL (ref 3.87–5.11)
RDW: 13.1 % (ref 11.5–15.5)
WBC: 8 10*3/uL (ref 4.0–10.5)

## 2014-05-17 NOTE — Progress Notes (Signed)
TdaP 28 wks labs today

## 2014-05-17 NOTE — Patient Instructions (Signed)
Third Trimester of Pregnancy The third trimester is from week 29 through week 42, months 7 through 9. The third trimester is a time when the fetus is growing rapidly. At the end of the ninth month, the fetus is about 20 inches in length and weighs 6-10 pounds.  BODY CHANGES Your body goes through many changes during pregnancy. The changes vary from woman to woman.   Your weight will continue to increase. You can expect to gain 25-35 pounds (11-16 kg) by the end of the pregnancy.  You may begin to get stretch marks on your hips, abdomen, and breasts.  You may urinate more often because the fetus is moving lower into your pelvis and pressing on your bladder.  You may develop or continue to have heartburn as a result of your pregnancy.  You may develop constipation because certain hormones are causing the muscles that push waste through your intestines to slow down.  You may develop hemorrhoids or swollen, bulging veins (varicose veins).  You may have pelvic pain because of the weight gain and pregnancy hormones relaxing your joints between the bones in your pelvis. Backaches may result from overexertion of the muscles supporting your posture.  You may have changes in your hair. These can include thickening of your hair, rapid growth, and changes in texture. Some women also have hair loss during or after pregnancy, or hair that feels dry or thin. Your hair will most likely return to normal after your baby is born.  Your breasts will continue to grow and be tender. A yellow discharge may leak from your breasts called colostrum.  Your belly button may stick out.  You may feel short of breath because of your expanding uterus.  You may notice the fetus "dropping," or moving lower in your abdomen.  You may have a bloody mucus discharge. This usually occurs a few days to a week before labor begins.  Your cervix becomes thin and soft (effaced) near your due date. WHAT TO EXPECT AT YOUR  PRENATAL EXAMS  You will have prenatal exams every 2 weeks until week 36. Then, you will have weekly prenatal exams. During a routine prenatal visit:  You will be weighed to make sure you and the fetus are growing normally.  Your blood pressure is taken.  Your abdomen will be measured to track your baby's growth.  The fetal heartbeat will be listened to.  Any test results from the previous visit will be discussed.  You may have a cervical check near your due date to see if you have effaced. At around 36 weeks, your caregiver will check your cervix. At the same time, your caregiver will also perform a test on the secretions of the vaginal tissue. This test is to determine if a type of bacteria, Group B streptococcus, is present. Your caregiver will explain this further. Your caregiver may ask you:  What your birth plan is.  How you are feeling.  If you are feeling the baby move.  If you have had any abnormal symptoms, such as leaking fluid, bleeding, severe headaches, or abdominal cramping.  If you have any questions. Other tests or screenings that may be performed during your third trimester include:  Blood tests that check for low iron levels (anemia).  Fetal testing to check the health, activity level, and growth of the fetus. Testing is done if you have certain medical conditions or if there are problems during the pregnancy. FALSE LABOR You may feel small, irregular contractions that   eventually go away. These are called Braxton Hicks contractions, or false labor. Contractions may last for hours, days, or even weeks before true labor sets in. If contractions come at regular intervals, intensify, or become painful, it is best to be seen by your caregiver.  SIGNS OF LABOR   Menstrual-like cramps.  Contractions that are 5 minutes apart or less.  Contractions that start on the top of the uterus and spread down to the lower abdomen and back.  A sense of increased pelvic  pressure or back pain.  A watery or bloody mucus discharge that comes from the vagina. If you have any of these signs before the 37th week of pregnancy, call your caregiver right away. You need to go to the hospital to get checked immediately. HOME CARE INSTRUCTIONS   Avoid all smoking, herbs, alcohol, and unprescribed drugs. These chemicals affect the formation and growth of the baby.  Follow your caregiver's instructions regarding medicine use. There are medicines that are either safe or unsafe to take during pregnancy.  Exercise only as directed by your caregiver. Experiencing uterine cramps is a good sign to stop exercising.  Continue to eat regular, healthy meals.  Wear a good support bra for breast tenderness.  Do not use hot tubs, steam rooms, or saunas.  Wear your seat belt at all times when driving.  Avoid raw meat, uncooked cheese, cat litter boxes, and soil used by cats. These carry germs that can cause birth defects in the baby.  Take your prenatal vitamins.  Try taking a stool softener (if your caregiver approves) if you develop constipation. Eat more high-fiber foods, such as fresh vegetables or fruit and whole grains. Drink plenty of fluids to keep your urine clear or pale yellow.  Take warm sitz baths to soothe any pain or discomfort caused by hemorrhoids. Use hemorrhoid cream if your caregiver approves.  If you develop varicose veins, wear support hose. Elevate your feet for 15 minutes, 3-4 times a day. Limit salt in your diet.  Avoid heavy lifting, wear low heal shoes, and practice good posture.  Rest a lot with your legs elevated if you have leg cramps or low back pain.  Visit your dentist if you have not gone during your pregnancy. Use a soft toothbrush to brush your teeth and be gentle when you floss.  A sexual relationship may be continued unless your caregiver directs you otherwise.  Do not travel far distances unless it is absolutely necessary and only  with the approval of your caregiver.  Take prenatal classes to understand, practice, and ask questions about the labor and delivery.  Make a trial run to the hospital.  Pack your hospital bag.  Prepare the baby's nursery.  Continue to go to all your prenatal visits as directed by your caregiver. SEEK MEDICAL CARE IF:  You are unsure if you are in labor or if your water has broken.  You have dizziness.  You have mild pelvic cramps, pelvic pressure, or nagging pain in your abdominal area.  You have persistent nausea, vomiting, or diarrhea.  You have a bad smelling vaginal discharge.  You have pain with urination. SEEK IMMEDIATE MEDICAL CARE IF:   You have a fever.  You are leaking fluid from your vagina.  You have spotting or bleeding from your vagina.  You have severe abdominal cramping or pain.  You have rapid weight loss or gain.  You have shortness of breath with chest pain.  You notice sudden or extreme swelling   of your face, hands, ankles, feet, or legs.  You have not felt your baby move in over an hour.  You have severe headaches that do not go away with medicine.  You have vision changes. Document Released: 09/24/2001 Document Revised: 10/05/2013 Document Reviewed: 12/01/2012 [employer] Patient Information 2015 [employer]. This information is not intended to replace advice given to you by your health care provider. Make sure you discuss any questions you have with your health care provider.  Breastfeeding Deciding to breastfeed is one of the best choices you can make for you and your baby. A change in hormones during pregnancy causes your breast tissue to grow and increases the number and size of your milk ducts. These hormones also allow proteins, sugars, and fats from your blood supply to make breast milk in your milk-producing glands. Hormones prevent breast milk from being released before your baby is born as well as prompt milk flow after birth. Once  breastfeeding has begun, thoughts of your baby, as well as his or her sucking or crying, can stimulate the release of milk from your milk-producing glands.  BENEFITS OF BREASTFEEDING For Your Baby  Your first milk (colostrum) helps your baby's digestive system function better.   There are antibodies in your milk that help your baby fight off infections.   Your baby has a lower incidence of asthma, allergies, and sudden infant death syndrome.   The nutrients in breast milk are better for your baby than infant formulas and are designed uniquely for your baby's needs.   Breast milk improves your baby's brain development.   Your baby is less likely to develop other conditions, such as childhood obesity, asthma, or type 2 diabetes mellitus.  For You   Breastfeeding helps to create a very special bond between you and your baby.   Breastfeeding is convenient. Breast milk is always available at the correct temperature and costs nothing.   Breastfeeding helps to burn calories and helps you lose the weight gained during pregnancy.   Breastfeeding makes your uterus contract to its prepregnancy size faster and slows bleeding (lochia) after you give birth.   Breastfeeding helps to lower your risk of developing type 2 diabetes mellitus, osteoporosis, and breast or ovarian cancer later in life. SIGNS THAT YOUR BABY IS HUNGRY Early Signs of Hunger  Increased alertness or activity.  Stretching.  Movement of the head from side to side.  Movement of the head and opening of the mouth when the corner of the mouth or cheek is stroked (rooting).  Increased sucking sounds, smacking lips, cooing, sighing, or squeaking.  Hand-to-mouth movements.  Increased sucking of fingers or hands. Late Signs of Hunger  Fussing.  Intermittent crying. Extreme Signs of Hunger Signs of extreme hunger will require calming and consoling before your baby will be able to breastfeed successfully. Do not  wait for the following signs of extreme hunger to occur before you initiate breastfeeding:   Restlessness.  A loud, strong cry.   Screaming. BREASTFEEDING BASICS Breastfeeding Initiation  Find a comfortable place to sit or lie down, with your neck and back well supported.  Place a pillow or rolled up blanket under your baby to bring him or her to the level of your breast (if you are seated). Nursing pillows are specially designed to help support your arms and your baby while you breastfeed.  Make sure that your baby's abdomen is facing your abdomen.   Gently massage your breast. With your fingertips, massage from your chest   wall toward your nipple in a circular motion. This encourages milk flow. You may need to continue this action during the feeding if your milk flows slowly.  Support your breast with 4 fingers underneath and your thumb above your nipple. Make sure your fingers are well away from your nipple and your baby's mouth.   Stroke your baby's lips gently with your finger or nipple.   When your baby's mouth is open wide enough, quickly bring your baby to your breast, placing your entire nipple and as much of the colored area around your nipple (areola) as possible into your baby's mouth.   More areola should be visible above your baby's upper lip than below the lower lip.   Your baby's tongue should be between his or her lower gum and your breast.   Ensure that your baby's mouth is correctly positioned around your nipple (latched). Your baby's lips should create a seal on your breast and be turned out (everted).  It is common for your baby to suck about 2-3 minutes in order to start the flow of breast milk. Latching Teaching your baby how to latch on to your breast properly is very important. An improper latch can cause nipple pain and decreased milk supply for you and poor weight gain in your baby. Also, if your baby is not latched onto your nipple properly, he or she  may swallow some air during feeding. This can make your baby fussy. Burping your baby when you switch breasts during the feeding can help to get rid of the air. However, teaching your baby to latch on properly is still the best way to prevent fussiness from swallowing air while breastfeeding. Signs that your baby has successfully latched on to your nipple:    Silent tugging or silent sucking, without causing you pain.   Swallowing heard between every 3-4 sucks.    Muscle movement above and in front of his or her ears while sucking.  Signs that your baby has not successfully latched on to nipple:   Sucking sounds or smacking sounds from your baby while breastfeeding.  Nipple pain. If you think your baby has not latched on correctly, slip your finger into the corner of your baby's mouth to break the suction and place it between your baby's gums. Attempt breastfeeding initiation again. Signs of Successful Breastfeeding Signs from your baby:   A gradual decrease in the number of sucks or complete cessation of sucking.   Falling asleep.   Relaxation of his or her body.   Retention of a small amount of milk in his or her mouth.   Letting go of your breast by himself or herself. Signs from you:  Breasts that have increased in firmness, weight, and size 1-3 hours after feeding.   Breasts that are softer immediately after breastfeeding.  Increased milk volume, as well as a change in milk consistency and color by the fifth day of breastfeeding.   Nipples that are not sore, cracked, or bleeding. Signs That Your Baby is Getting Enough Milk  Wetting at least 3 diapers in a 24-hour period. The urine should be clear and pale yellow by age 5 days.  At least 3 stools in a 24-hour period by age 5 days. The stool should be soft and yellow.  At least 3 stools in a 24-hour period by age 7 days. The stool should be seedy and yellow.  No loss of weight greater than 10% of birth weight  during the first 3   days of age.  Average weight gain of 4-7 ounces (113-198 g) per week after age 4 days.  Consistent daily weight gain by age 5 days, without weight loss after the age of 2 weeks. After a feeding, your baby may spit up a small amount. This is common. BREASTFEEDING FREQUENCY AND DURATION Frequent feeding will help you make more milk and can prevent sore nipples and breast engorgement. Breastfeed when you feel the need to reduce the fullness of your breasts or when your baby shows signs of hunger. This is called "breastfeeding on demand." Avoid introducing a pacifier to your baby while you are working to establish breastfeeding (the first 4-6 weeks after your baby is born). After this time you may choose to use a pacifier. Research has shown that pacifier use during the first year of a baby's life decreases the risk of sudden infant death syndrome (SIDS). Allow your baby to feed on each breast as long as he or she wants. Breastfeed until your baby is finished feeding. When your baby unlatches or falls asleep while feeding from the first breast, offer the second breast. Because newborns are often sleepy in the first few weeks of life, you may need to awaken your baby to get him or her to feed. Breastfeeding times will vary from baby to baby. However, the following rules can serve as a guide to help you ensure that your baby is properly fed:  Newborns (babies 4 weeks of age or younger) may breastfeed every 1-3 hours.  Newborns should not go longer than 3 hours during the day or 5 hours during the night without breastfeeding.  You should breastfeed your baby a minimum of 8 times in a 24-hour period until you begin to introduce solid foods to your baby at around 6 months of age. BREAST MILK PUMPING Pumping and storing breast milk allows you to ensure that your baby is exclusively fed your breast milk, even at times when you are unable to breastfeed. This is especially important if you are  going back to work while you are still breastfeeding or when you are not able to be present during feedings. Your lactation consultant can give you guidelines on how long it is safe to store breast milk.  A breast pump is a machine that allows you to pump milk from your breast into a sterile bottle. The pumped breast milk can then be stored in a refrigerator or freezer. Some breast pumps are operated by hand, while others use electricity. Ask your lactation consultant which type will work best for you. Breast pumps can be purchased, but some hospitals and breastfeeding support groups lease breast pumps on a monthly basis. A lactation consultant can teach you how to hand express breast milk, if you prefer not to use a pump.  CARING FOR YOUR BREASTS WHILE YOU BREASTFEED Nipples can become dry, cracked, and sore while breastfeeding. The following recommendations can help keep your breasts moisturized and healthy:  Avoid using soap on your nipples.   Wear a supportive bra. Although not required, special nursing bras and tank tops are designed to allow access to your breasts for breastfeeding without taking off your entire bra or top. Avoid wearing underwire-style bras or extremely tight bras.  Air dry your nipples for 3-4minutes after each feeding.   Use only cotton bra pads to absorb leaked breast milk. Leaking of breast milk between feedings is normal.   Use lanolin on your nipples after breastfeeding. Lanolin helps to maintain your skin's   normal moisture barrier. If you use pure lanolin, you do not need to wash it off before feeding your baby again. Pure lanolin is not toxic to your baby. You may also hand express a few drops of breast milk and gently massage that milk into your nipples and allow the milk to air dry. In the first few weeks after giving birth, some women experience extremely full breasts (engorgement). Engorgement can make your breasts feel heavy, warm, and tender to the touch.  Engorgement peaks within 3-5 days after you give birth. The following recommendations can help ease engorgement:  Completely empty your breasts while breastfeeding or pumping. You may want to start by applying warm, moist heat (in the shower or with warm water-soaked hand towels) just before feeding or pumping. This increases circulation and helps the milk flow. If your baby does not completely empty your breasts while breastfeeding, pump any extra milk after he or she is finished.  Wear a snug bra (nursing or regular) or tank top for 1-2 days to signal your body to slightly decrease milk production.  Apply ice packs to your breasts, unless this is too uncomfortable for you.  Make sure that your baby is latched on and positioned properly while breastfeeding. If engorgement persists after 48 hours of following these recommendations, contact your health care provider or a lactation consultant. OVERALL HEALTH CARE RECOMMENDATIONS WHILE BREASTFEEDING  Eat healthy foods. Alternate between meals and snacks, eating 3 of each per day. Because what you eat affects your breast milk, some of the foods may make your baby more irritable than usual. Avoid eating these foods if you are sure that they are negatively affecting your baby.  Drink milk, fruit juice, and water to satisfy your thirst (about 10 glasses a day).   Rest often, relax, and continue to take your prenatal vitamins to prevent fatigue, stress, and anemia.  Continue breast self-awareness checks.  Avoid chewing and smoking tobacco.  Avoid alcohol and drug use. Some medicines that may be harmful to your baby can pass through breast milk. It is important to ask your health care provider before taking any medicine, including all over-the-counter and prescription medicine as well as vitamin and herbal supplements. It is possible to become pregnant while breastfeeding. If birth control is desired, ask your health care provider about options that  will be safe for your baby. SEEK MEDICAL CARE IF:   You feel like you want to stop breastfeeding or have become frustrated with breastfeeding.  You have painful breasts or nipples.  Your nipples are cracked or bleeding.  Your breasts are red, tender, or warm.  You have a swollen area on either breast.  You have a fever or chills.  You have nausea or vomiting.  You have drainage other than breast milk from your nipples.  Your breasts do not become full before feedings by the fifth day after you give birth.  You feel sad and depressed.  Your baby is too sleepy to eat well.  Your baby is having trouble sleeping.   Your baby is wetting less than 3 diapers in a 24-hour period.  Your baby has less than 3 stools in a 24-hour period.  Your baby's skin or the white part of his or her eyes becomes yellow.   Your baby is not gaining weight by 5 days of age. SEEK IMMEDIATE MEDICAL CARE IF:   Your baby is overly tired (lethargic) and does not want to wake up and feed.  Your baby   develops an unexplained fever. Document Released: 09/30/2005 Document Revised: 10/05/2013 Document Reviewed: 03/24/2013 [employer] Patient Information 2015 [employer]. This information is not intended to replace advice given to you by your health care provider. Make sure you discuss any questions you have with your health care provider.  

## 2014-05-18 LAB — HIV ANTIBODY (ROUTINE TESTING W REFLEX): HIV 1&2 Ab, 4th Generation: NONREACTIVE

## 2014-05-18 LAB — GLUCOSE TOLERANCE, 1 HOUR (50G) W/O FASTING: Glucose, 1 Hour GTT: 115 mg/dL (ref 70–140)

## 2014-05-18 LAB — RPR

## 2014-05-19 ENCOUNTER — Encounter: Payer: Self-pay | Admitting: Family Medicine

## 2014-05-31 ENCOUNTER — Encounter: Payer: Self-pay | Admitting: Obstetrics & Gynecology

## 2014-05-31 ENCOUNTER — Ambulatory Visit (INDEPENDENT_AMBULATORY_CARE_PROVIDER_SITE_OTHER): Payer: BC Managed Care – PPO | Admitting: Obstetrics & Gynecology

## 2014-05-31 VITALS — BP 125/80 | HR 82 | Wt 150.2 lb

## 2014-05-31 DIAGNOSIS — N390 Urinary tract infection, site not specified: Secondary | ICD-10-CM

## 2014-05-31 DIAGNOSIS — B951 Streptococcus, group B, as the cause of diseases classified elsewhere: Secondary | ICD-10-CM

## 2014-05-31 DIAGNOSIS — Z3480 Encounter for supervision of other normal pregnancy, unspecified trimester: Secondary | ICD-10-CM

## 2014-05-31 DIAGNOSIS — O2343 Unspecified infection of urinary tract in pregnancy, third trimester: Secondary | ICD-10-CM

## 2014-05-31 DIAGNOSIS — Z3483 Encounter for supervision of other normal pregnancy, third trimester: Secondary | ICD-10-CM

## 2014-05-31 DIAGNOSIS — O2390 Unspecified genitourinary tract infection in pregnancy, unspecified trimester: Secondary | ICD-10-CM

## 2014-05-31 NOTE — Progress Notes (Signed)
1 hr GTT 115, normal third trimester labs. Undecided about contraception; options discussed. No other complaints or concerns.  Labor and fetal movement precautions reviewed.

## 2014-05-31 NOTE — Patient Instructions (Signed)
Return to clinic for any obstetric concerns or go to [facility] for evaluation  

## 2014-06-14 ENCOUNTER — Ambulatory Visit (INDEPENDENT_AMBULATORY_CARE_PROVIDER_SITE_OTHER): Payer: BC Managed Care – PPO | Admitting: Obstetrics & Gynecology

## 2014-06-14 VITALS — BP 117/81 | HR 76 | Wt 152.0 lb

## 2014-06-14 DIAGNOSIS — Z3483 Encounter for supervision of other normal pregnancy, third trimester: Secondary | ICD-10-CM

## 2014-06-14 DIAGNOSIS — Z3480 Encounter for supervision of other normal pregnancy, unspecified trimester: Secondary | ICD-10-CM

## 2014-06-14 MED ORDER — BREAST PUMP MISC: Status: DC

## 2014-06-14 NOTE — Progress Notes (Signed)
She desires POPs for postpartum contraception.  No other complaints or concerns.  Labor and fetal movement precautions reviewed.

## 2014-06-14 NOTE — Patient Instructions (Signed)
Return to clinic for any obstetric concerns or go to [facility] for evaluation  

## 2014-06-25 ENCOUNTER — Encounter (HOSPITAL_COMMUNITY): Payer: Self-pay

## 2014-06-25 ENCOUNTER — Inpatient Hospital Stay (HOSPITAL_COMMUNITY)
Admission: AD | Admit: 2014-06-25 | Discharge: 2014-06-26 | Disposition: A | Discharge status: Home / Self Care | Payer: BC Managed Care – PPO | Source: Ambulatory Visit | Attending: Obstetrics & Gynecology | Admitting: Obstetrics & Gynecology

## 2014-06-25 DIAGNOSIS — O4690 Antepartum hemorrhage, unspecified, unspecified trimester: Secondary | ICD-10-CM | POA: Insufficient documentation

## 2014-06-25 DIAGNOSIS — G43909 Migraine, unspecified, not intractable, without status migrainosus: Secondary | ICD-10-CM | POA: Insufficient documentation

## 2014-06-25 DIAGNOSIS — O210 Mild hyperemesis gravidarum: Secondary | ICD-10-CM | POA: Diagnosis not present

## 2014-06-25 DIAGNOSIS — K5289 Other specified noninfective gastroenteritis and colitis: Secondary | ICD-10-CM

## 2014-06-25 DIAGNOSIS — O212 Late vomiting of pregnancy: Secondary | ICD-10-CM | POA: Insufficient documentation

## 2014-06-25 LAB — URINALYSIS, ROUTINE W REFLEX MICROSCOPIC
Bilirubin Urine: NEGATIVE
GLUCOSE, UA: NEGATIVE mg/dL
Hgb urine dipstick: NEGATIVE
Ketones, ur: >80 mg/dL — AB
Leukocytes, UA: NEGATIVE
Nitrite: NEGATIVE
PH: 7 (ref 5.0–8.0)
Protein, ur: NEGATIVE mg/dL
Specific Gravity, Urine: 1.01 (ref 1.005–1.030)
Urobilinogen, UA: 0.2 mg/dL (ref 0.0–1.0)

## 2014-06-25 MED ORDER — LACTATED RINGERS IV SOLN
INTRAVENOUS | Status: DC
  Administered 2014-06-25: 23:00:00 via INTRAVENOUS

## 2014-06-25 MED ORDER — PROMETHAZINE HCL 25 MG/ML IJ SOLN
12.5000 mg | Freq: Four times a day (QID) | INTRAMUSCULAR | Status: DC | PRN
  Administered 2014-06-25: 12.5 mg via INTRAVENOUS
  Filled 2014-06-25: qty 1

## 2014-06-25 MED ORDER — HYDROMORPHONE HCL PF 1 MG/ML IJ SOLN
0.5000 mg | Freq: Once | INTRAMUSCULAR | Status: AC
  Administered 2014-06-25: 0.5 mg via INTRAVENOUS
  Filled 2014-06-25: qty 1

## 2014-06-25 NOTE — MAU Note (Signed)
Have migraine which started this am and gotten worse. N/V for couple hrs. Having pressure in lower back. Had diarrhea once. When i vomited earlier i leaked fld and have leaked some since then. Clear fld. Did not wear in pad

## 2014-06-25 NOTE — MAU Provider Note (Signed)
History     CSN: [id number]  Arrival date and time: 06/25/14 2100   None     No chief complaint on file.  HPI This is a 23 y.o. female at [redacted]w[redacted]d who presents with c/o nausea and vomiting which started this evening. Has a migraine headache since morning.  One episode of diarrhea. Denies fever or aches. One episode of leaking when vomiting.    RN Note:  Have migraine which started this am and gotten worse. N/V for couple hrs. Having pressure in lower back. Had diarrhea once. When i vomited earlier i leaked fld and have leaked some since then. Clear fld. Did not wear in pad       OB History   Grav Para Term Preterm Abortions TAB SAB Ect Mult Living   1               Past Medical History  Diagnosis Date  . Supervision of other normal pregnancy 12/28/2013     Clinic [facility]  Dating 1st trimester sono equal to LMP    Genetic Screen 1 Screen:    normal            AFP:  WNL                                   Anatomic US WNL  GTT Third trimester:   CF screen Negative  TDaP vaccine   Flu vaccine   GBS Positive  Contraception   Baby Food breast  Circumcision   Pediatrician   Support Person      . Medical history non-contributory     Past Surgical History  Procedure Laterality Date  . Knee surgery Left 1992  . Wisdom tooth extraction    . No past surgeries      Family History  Problem Relation Age of Onset  . Anemia Mother   . Anemia Maternal Grandfather   . Cancer Maternal Grandfather     COLON/LIVER    History  Substance Use Topics  . Smoking status: Never Smoker   . Smokeless tobacco: Never Used  . Alcohol Use: No    Allergies: No Known Allergies  Prescriptions prior to admission  Medication Sig Dispense Refill  . Misc. Devices (BREAST PUMP) MISC Dispense one breast pump for patient  1 each  0  . Prenatal Multivit-Min-Fe-FA (PRENATAL VITAMINS PO) Take by mouth.        Review of Systems  Constitutional: Negative for fever, chills and malaise/fatigue.  Eyes:  Negative for blurred vision.  Gastrointestinal: Positive for nausea, vomiting and diarrhea (one episode). Negative for abdominal pain and constipation.  Genitourinary: Negative for dysuria.  Neurological: Positive for headaches. Negative for dizziness and weakness.   Physical Exam   Blood pressure 123/77, pulse 61, temperature 97.9 F (36.6 C), resp. rate 18, last menstrual period 10/16/2013.  Physical Exam  Constitutional: She is oriented to person, place, and time. She appears well-developed and well-nourished. No distress.  HENT:  Head: Normocephalic.  Cardiovascular: Normal rate.   Respiratory: Effort normal.  GI: Soft. She exhibits no distension and no mass. There is no tenderness. There is no rebound and no guarding.  Genitourinary: Vaginal discharge (creamy discharge, no pooling, no ferning) found.   Cervix 1-2/80/-1/vtx   Musculoskeletal: Normal range of motion.  Neurological: She is alert and oriented to person, place, and time.  Skin: Skin is warm and dry.  Psychiatric: She  has a normal mood and affect.    MAU Course  Procedures  MDM Will give some IV fluids and Dilaudid+Phenergan for migraine and nausea. Headache some better but not altogether, will give other half of Dilaudid dose After voiding,noted vaginal bleeding >> Speculum exam. Blood at cervix, not flowing, mucoid. Retested with negative ferning. Discussed with Dr Macon Large, will not re-US.  Placenta posterior on last [place]. Probably cervical friability. Irregular contractions.   Assessment and Plan  A:  [facility] at [redacted]w[redacted]d        Migraine headache       Nausea       Bleeding in third trimester, probably cervical   P:  Discussed with DR Anyanwu       Discharge home       Pelvic rest       PO hydration       Follow up in office         [facility] 06/25/2014, 11:10 PM

## 2014-06-26 DIAGNOSIS — O4690 Antepartum hemorrhage, unspecified, unspecified trimester: Secondary | ICD-10-CM

## 2014-06-26 DIAGNOSIS — O210 Mild hyperemesis gravidarum: Secondary | ICD-10-CM

## 2014-06-26 DIAGNOSIS — G43909 Migraine, unspecified, not intractable, without status migrainosus: Secondary | ICD-10-CM | POA: Diagnosis not present

## 2014-06-26 DIAGNOSIS — O212 Late vomiting of pregnancy: Secondary | ICD-10-CM | POA: Diagnosis not present

## 2014-06-26 MED ORDER — HYDROMORPHONE HCL PF 1 MG/ML IJ SOLN
0.5000 mg | Freq: Once | INTRAMUSCULAR | Status: AC
Start: 2014-06-26 — End: 2014-06-26
  Administered 2014-06-26: 0.5 mg via INTRAVENOUS
  Filled 2014-06-26: qty 1

## 2014-06-26 MED ORDER — PROMETHAZINE HCL 25 MG PO TABS: 25.0000 mg | ORAL_TABLET | Freq: Four times a day (QID) | ORAL | Status: DC | PRN

## 2014-06-26 NOTE — Discharge Instructions (Signed)

## 2014-06-27 NOTE — MAU Provider Note (Signed)
Attestation of Attending Supervision of Advanced Practitioner (PA/CNM/NP): Evaluation and management procedures were performed by the Advanced Practitioner under my supervision and collaboration.  I have reviewed the Advanced Practitioner's note and chart, and I agree with the management and plan.  Rutledge Selsor, MD, FACOG Attending Obstetrician & Gynecologist Faculty Practice, [facility]   

## 2014-06-28 ENCOUNTER — Encounter: Payer: Self-pay | Admitting: Family Medicine

## 2014-06-28 ENCOUNTER — Ambulatory Visit (INDEPENDENT_AMBULATORY_CARE_PROVIDER_SITE_OTHER): Payer: BC Managed Care – PPO | Admitting: Family Medicine

## 2014-06-28 VITALS — BP 117/78 | HR 80 | Wt 158.6 lb

## 2014-06-28 DIAGNOSIS — Z3483 Encounter for supervision of other normal pregnancy, third trimester: Secondary | ICD-10-CM

## 2014-06-28 DIAGNOSIS — Z3480 Encounter for supervision of other normal pregnancy, unspecified trimester: Secondary | ICD-10-CM

## 2014-06-28 DIAGNOSIS — Z23 Encounter for immunization: Secondary | ICD-10-CM

## 2014-06-28 NOTE — Progress Notes (Signed)
Patient was seen in MAU Saturday for a terrible migraine, nausea and vomiting and she was having some vaginal bleeding.  They told her she was dilated 1 1/2 cm.  She continues to have a small amount of bleeding.

## 2014-06-28 NOTE — Patient Instructions (Signed)
Third Trimester of Pregnancy The third trimester is from week 29 through week 42, months 7 through 9. The third trimester is a time when the fetus is growing rapidly. At the end of the ninth month, the fetus is about 20 inches in length and weighs 6-10 pounds.  BODY CHANGES Your body goes through many changes during pregnancy. The changes vary from woman to woman.   Your weight will continue to increase. You can expect to gain 25-35 pounds (11-16 kg) by the end of the pregnancy.  You may begin to get stretch marks on your hips, abdomen, and breasts.  You may urinate more often because the fetus is moving lower into your pelvis and pressing on your bladder.  You may develop or continue to have heartburn as a result of your pregnancy.  You may develop constipation because certain hormones are causing the muscles that push waste through your intestines to slow down.  You may develop hemorrhoids or swollen, bulging veins (varicose veins).  You may have pelvic pain because of the weight gain and pregnancy hormones relaxing your joints between the bones in your pelvis. Backaches may result from overexertion of the muscles supporting your posture.  You may have changes in your hair. These can include thickening of your hair, rapid growth, and changes in texture. Some women also have hair loss during or after pregnancy, or hair that feels dry or thin. Your hair will most likely return to normal after your baby is born.  Your breasts will continue to grow and be tender. A yellow discharge may leak from your breasts called colostrum.  Your belly button may stick out.  You may feel short of breath because of your expanding uterus.  You may notice the fetus "dropping," or moving lower in your abdomen.  You may have a bloody mucus discharge. This usually occurs a few days to a week before labor begins.  Your cervix becomes thin and soft (effaced) near your due date. WHAT TO EXPECT AT YOUR  PRENATAL EXAMS  You will have prenatal exams every 2 weeks until week 36. Then, you will have weekly prenatal exams. During a routine prenatal visit:  You will be weighed to make sure you and the fetus are growing normally.  Your blood pressure is taken.  Your abdomen will be measured to track your baby's growth.  The fetal heartbeat will be listened to.  Any test results from the previous visit will be discussed.  You may have a cervical check near your due date to see if you have effaced. At around 36 weeks, your caregiver will check your cervix. At the same time, your caregiver will also perform a test on the secretions of the vaginal tissue. This test is to determine if a type of bacteria, Group B streptococcus, is present. Your caregiver will explain this further. Your caregiver may ask you:  What your birth plan is.  How you are feeling.  If you are feeling the baby move.  If you have had any abnormal symptoms, such as leaking fluid, bleeding, severe headaches, or abdominal cramping.  If you have any questions. Other tests or screenings that may be performed during your third trimester include:  Blood tests that check for low iron levels (anemia).  Fetal testing to check the health, activity level, and growth of the fetus. Testing is done if you have certain medical conditions or if there are problems during the pregnancy. FALSE LABOR You may feel small, irregular contractions that   eventually go away. These are called Braxton Hicks contractions, or false labor. Contractions may last for hours, days, or even weeks before true labor sets in. If contractions come at regular intervals, intensify, or become painful, it is best to be seen by your caregiver.  SIGNS OF LABOR   Menstrual-like cramps.  Contractions that are 5 minutes apart or less.  Contractions that start on the top of the uterus and spread down to the lower abdomen and back.  A sense of increased pelvic  pressure or back pain.  A watery or bloody mucus discharge that comes from the vagina. If you have any of these signs before the 37th week of pregnancy, call your caregiver right away. You need to go to the hospital to get checked immediately. HOME CARE INSTRUCTIONS   Avoid all smoking, herbs, alcohol, and unprescribed drugs. These chemicals affect the formation and growth of the baby.  Follow your caregiver's instructions regarding medicine use. There are medicines that are either safe or unsafe to take during pregnancy.  Exercise only as directed by your caregiver. Experiencing uterine cramps is a good sign to stop exercising.  Continue to eat regular, healthy meals.  Wear a good support bra for breast tenderness.  Do not use hot tubs, steam rooms, or saunas.  Wear your seat belt at all times when driving.  Avoid raw meat, uncooked cheese, cat litter boxes, and soil used by cats. These carry germs that can cause birth defects in the baby.  Take your prenatal vitamins.  Try taking a stool softener (if your caregiver approves) if you develop constipation. Eat more high-fiber foods, such as fresh vegetables or fruit and whole grains. Drink plenty of fluids to keep your urine clear or pale yellow.  Take warm sitz baths to soothe any pain or discomfort caused by hemorrhoids. Use hemorrhoid cream if your caregiver approves.  If you develop varicose veins, wear support hose. Elevate your feet for 15 minutes, 3-4 times a day. Limit salt in your diet.  Avoid heavy lifting, wear low heal shoes, and practice good posture.  Rest a lot with your legs elevated if you have leg cramps or low back pain.  Visit your dentist if you have not gone during your pregnancy. Use a soft toothbrush to brush your teeth and be gentle when you floss.  A sexual relationship may be continued unless your caregiver directs you otherwise.  Do not travel far distances unless it is absolutely necessary and only  with the approval of your caregiver.  Take prenatal classes to understand, practice, and ask questions about the labor and delivery.  Make a trial run to the hospital.  Pack your hospital bag.  Prepare the baby's nursery.  Continue to go to all your prenatal visits as directed by your caregiver. SEEK MEDICAL CARE IF:  You are unsure if you are in labor or if your water has broken.  You have dizziness.  You have mild pelvic cramps, pelvic pressure, or nagging pain in your abdominal area.  You have persistent nausea, vomiting, or diarrhea.  You have a bad smelling vaginal discharge.  You have pain with urination. SEEK IMMEDIATE MEDICAL CARE IF:   You have a fever.  You are leaking fluid from your vagina.  You have spotting or bleeding from your vagina.  You have severe abdominal cramping or pain.  You have rapid weight loss or gain.  You have shortness of breath with chest pain.  You notice sudden or extreme swelling   of your face, hands, ankles, feet, or legs.  You have not felt your baby move in over an hour.  You have severe headaches that do not go away with medicine.  You have vision changes. Document Released: 09/24/2001 Document Revised: 10/05/2013 Document Reviewed: 12/01/2012 [employer] Patient Information 2015 [employer]. This information is not intended to replace advice given to you by your health care provider. Make sure you discuss any questions you have with your health care provider.  Breastfeeding Deciding to breastfeed is one of the best choices you can make for you and your baby. A change in hormones during pregnancy causes your breast tissue to grow and increases the number and size of your milk ducts. These hormones also allow proteins, sugars, and fats from your blood supply to make breast milk in your milk-producing glands. Hormones prevent breast milk from being released before your baby is born as well as prompt milk flow after birth. Once  breastfeeding has begun, thoughts of your baby, as well as his or her sucking or crying, can stimulate the release of milk from your milk-producing glands.  BENEFITS OF BREASTFEEDING For Your Baby  Your first milk (colostrum) helps your baby's digestive system function better.   There are antibodies in your milk that help your baby fight off infections.   Your baby has a lower incidence of asthma, allergies, and sudden infant death syndrome.   The nutrients in breast milk are better for your baby than infant formulas and are designed uniquely for your baby's needs.   Breast milk improves your baby's brain development.   Your baby is less likely to develop other conditions, such as childhood obesity, asthma, or type 2 diabetes mellitus.  For You   Breastfeeding helps to create a very special bond between you and your baby.   Breastfeeding is convenient. Breast milk is always available at the correct temperature and costs nothing.   Breastfeeding helps to burn calories and helps you lose the weight gained during pregnancy.   Breastfeeding makes your uterus contract to its prepregnancy size faster and slows bleeding (lochia) after you give birth.   Breastfeeding helps to lower your risk of developing type 2 diabetes mellitus, osteoporosis, and breast or ovarian cancer later in life. SIGNS THAT YOUR BABY IS HUNGRY Early Signs of Hunger  Increased alertness or activity.  Stretching.  Movement of the head from side to side.  Movement of the head and opening of the mouth when the corner of the mouth or cheek is stroked (rooting).  Increased sucking sounds, smacking lips, cooing, sighing, or squeaking.  Hand-to-mouth movements.  Increased sucking of fingers or hands. Late Signs of Hunger  Fussing.  Intermittent crying. Extreme Signs of Hunger Signs of extreme hunger will require calming and consoling before your baby will be able to breastfeed successfully. Do not  wait for the following signs of extreme hunger to occur before you initiate breastfeeding:   Restlessness.  A loud, strong cry.   Screaming. BREASTFEEDING BASICS Breastfeeding Initiation  Find a comfortable place to sit or lie down, with your neck and back well supported.  Place a pillow or rolled up blanket under your baby to bring him or her to the level of your breast (if you are seated). Nursing pillows are specially designed to help support your arms and your baby while you breastfeed.  Make sure that your baby's abdomen is facing your abdomen.   Gently massage your breast. With your fingertips, massage from your chest   wall toward your nipple in a circular motion. This encourages milk flow. You may need to continue this action during the feeding if your milk flows slowly.  Support your breast with 4 fingers underneath and your thumb above your nipple. Make sure your fingers are well away from your nipple and your baby's mouth.   Stroke your baby's lips gently with your finger or nipple.   When your baby's mouth is open wide enough, quickly bring your baby to your breast, placing your entire nipple and as much of the colored area around your nipple (areola) as possible into your baby's mouth.   More areola should be visible above your baby's upper lip than below the lower lip.   Your baby's tongue should be between his or her lower gum and your breast.   Ensure that your baby's mouth is correctly positioned around your nipple (latched). Your baby's lips should create a seal on your breast and be turned out (everted).  It is common for your baby to suck about 2-3 minutes in order to start the flow of breast milk. Latching Teaching your baby how to latch on to your breast properly is very important. An improper latch can cause nipple pain and decreased milk supply for you and poor weight gain in your baby. Also, if your baby is not latched onto your nipple properly, he or she  may swallow some air during feeding. This can make your baby fussy. Burping your baby when you switch breasts during the feeding can help to get rid of the air. However, teaching your baby to latch on properly is still the best way to prevent fussiness from swallowing air while breastfeeding. Signs that your baby has successfully latched on to your nipple:    Silent tugging or silent sucking, without causing you pain.   Swallowing heard between every 3-4 sucks.    Muscle movement above and in front of his or her ears while sucking.  Signs that your baby has not successfully latched on to nipple:   Sucking sounds or smacking sounds from your baby while breastfeeding.  Nipple pain. If you think your baby has not latched on correctly, slip your finger into the corner of your baby's mouth to break the suction and place it between your baby's gums. Attempt breastfeeding initiation again. Signs of Successful Breastfeeding Signs from your baby:   A gradual decrease in the number of sucks or complete cessation of sucking.   Falling asleep.   Relaxation of his or her body.   Retention of a small amount of milk in his or her mouth.   Letting go of your breast by himself or herself. Signs from you:  Breasts that have increased in firmness, weight, and size 1-3 hours after feeding.   Breasts that are softer immediately after breastfeeding.  Increased milk volume, as well as a change in milk consistency and color by the fifth day of breastfeeding.   Nipples that are not sore, cracked, or bleeding. Signs That Your Baby is Getting Enough Milk  Wetting at least 3 diapers in a 24-hour period. The urine should be clear and pale yellow by age 5 days.  At least 3 stools in a 24-hour period by age 5 days. The stool should be soft and yellow.  At least 3 stools in a 24-hour period by age 7 days. The stool should be seedy and yellow.  No loss of weight greater than 10% of birth weight  during the first 3   days of age.  Average weight gain of 4-7 ounces (113-198 g) per week after age 4 days.  Consistent daily weight gain by age 5 days, without weight loss after the age of 2 weeks. After a feeding, your baby may spit up a small amount. This is common. BREASTFEEDING FREQUENCY AND DURATION Frequent feeding will help you make more milk and can prevent sore nipples and breast engorgement. Breastfeed when you feel the need to reduce the fullness of your breasts or when your baby shows signs of hunger. This is called "breastfeeding on demand." Avoid introducing a pacifier to your baby while you are working to establish breastfeeding (the first 4-6 weeks after your baby is born). After this time you may choose to use a pacifier. Research has shown that pacifier use during the first year of a baby's life decreases the risk of sudden infant death syndrome (SIDS). Allow your baby to feed on each breast as long as he or she wants. Breastfeed until your baby is finished feeding. When your baby unlatches or falls asleep while feeding from the first breast, offer the second breast. Because newborns are often sleepy in the first few weeks of life, you may need to awaken your baby to get him or her to feed. Breastfeeding times will vary from baby to baby. However, the following rules can serve as a guide to help you ensure that your baby is properly fed:  Newborns (babies 4 weeks of age or younger) may breastfeed every 1-3 hours.  Newborns should not go longer than 3 hours during the day or 5 hours during the night without breastfeeding.  You should breastfeed your baby a minimum of 8 times in a 24-hour period until you begin to introduce solid foods to your baby at around 6 months of age. BREAST MILK PUMPING Pumping and storing breast milk allows you to ensure that your baby is exclusively fed your breast milk, even at times when you are unable to breastfeed. This is especially important if you are  going back to work while you are still breastfeeding or when you are not able to be present during feedings. Your lactation consultant can give you guidelines on how long it is safe to store breast milk.  A breast pump is a machine that allows you to pump milk from your breast into a sterile bottle. The pumped breast milk can then be stored in a refrigerator or freezer. Some breast pumps are operated by hand, while others use electricity. Ask your lactation consultant which type will work best for you. Breast pumps can be purchased, but some hospitals and breastfeeding support groups lease breast pumps on a monthly basis. A lactation consultant can teach you how to hand express breast milk, if you prefer not to use a pump.  CARING FOR YOUR BREASTS WHILE YOU BREASTFEED Nipples can become dry, cracked, and sore while breastfeeding. The following recommendations can help keep your breasts moisturized and healthy:  Avoid using soap on your nipples.   Wear a supportive bra. Although not required, special nursing bras and tank tops are designed to allow access to your breasts for breastfeeding without taking off your entire bra or top. Avoid wearing underwire-style bras or extremely tight bras.  Air dry your nipples for 3-4minutes after each feeding.   Use only cotton bra pads to absorb leaked breast milk. Leaking of breast milk between feedings is normal.   Use lanolin on your nipples after breastfeeding. Lanolin helps to maintain your skin's   normal moisture barrier. If you use pure lanolin, you do not need to wash it off before feeding your baby again. Pure lanolin is not toxic to your baby. You may also hand express a few drops of breast milk and gently massage that milk into your nipples and allow the milk to air dry. In the first few weeks after giving birth, some women experience extremely full breasts (engorgement). Engorgement can make your breasts feel heavy, warm, and tender to the touch.  Engorgement peaks within 3-5 days after you give birth. The following recommendations can help ease engorgement:  Completely empty your breasts while breastfeeding or pumping. You may want to start by applying warm, moist heat (in the shower or with warm water-soaked hand towels) just before feeding or pumping. This increases circulation and helps the milk flow. If your baby does not completely empty your breasts while breastfeeding, pump any extra milk after he or she is finished.  Wear a snug bra (nursing or regular) or tank top for 1-2 days to signal your body to slightly decrease milk production.  Apply ice packs to your breasts, unless this is too uncomfortable for you.  Make sure that your baby is latched on and positioned properly while breastfeeding. If engorgement persists after 48 hours of following these recommendations, contact your health care provider or a lactation consultant. OVERALL HEALTH CARE RECOMMENDATIONS WHILE BREASTFEEDING  Eat healthy foods. Alternate between meals and snacks, eating 3 of each per day. Because what you eat affects your breast milk, some of the foods may make your baby more irritable than usual. Avoid eating these foods if you are sure that they are negatively affecting your baby.  Drink milk, fruit juice, and water to satisfy your thirst (about 10 glasses a day).   Rest often, relax, and continue to take your prenatal vitamins to prevent fatigue, stress, and anemia.  Continue breast self-awareness checks.  Avoid chewing and smoking tobacco.  Avoid alcohol and drug use. Some medicines that may be harmful to your baby can pass through breast milk. It is important to ask your health care provider before taking any medicine, including all over-the-counter and prescription medicine as well as vitamin and herbal supplements. It is possible to become pregnant while breastfeeding. If birth control is desired, ask your health care provider about options that  will be safe for your baby. SEEK MEDICAL CARE IF:   You feel like you want to stop breastfeeding or have become frustrated with breastfeeding.  You have painful breasts or nipples.  Your nipples are cracked or bleeding.  Your breasts are red, tender, or warm.  You have a swollen area on either breast.  You have a fever or chills.  You have nausea or vomiting.  You have drainage other than breast milk from your nipples.  Your breasts do not become full before feedings by the fifth day after you give birth.  You feel sad and depressed.  Your baby is too sleepy to eat well.  Your baby is having trouble sleeping.   Your baby is wetting less than 3 diapers in a 24-hour period.  Your baby has less than 3 stools in a 24-hour period.  Your baby's skin or the white part of his or her eyes becomes yellow.   Your baby is not gaining weight by 5 days of age. SEEK IMMEDIATE MEDICAL CARE IF:   Your baby is overly tired (lethargic) and does not want to wake up and feed.  Your baby   develops an unexplained fever. Document Released: 09/30/2005 Document Revised: 10/05/2013 Document Reviewed: 03/24/2013 [employer] Patient Information 2015 [employer]. This information is not intended to replace advice given to you by your health care provider. Make sure you discuss any questions you have with your health care provider.  

## 2014-06-28 NOTE — Progress Notes (Signed)
Seen in MAU for N/V Had some bleeding--still spotting after that exam showed 1.5 cm No real contractions Flu shot today

## 2014-07-05 ENCOUNTER — Ambulatory Visit (INDEPENDENT_AMBULATORY_CARE_PROVIDER_SITE_OTHER): Payer: BC Managed Care – PPO | Admitting: Family Medicine

## 2014-07-05 ENCOUNTER — Encounter: Payer: Self-pay | Admitting: Family Medicine

## 2014-07-05 VITALS — BP 98/79 | HR 92 | Wt 158.0 lb

## 2014-07-05 DIAGNOSIS — Z23 Encounter for immunization: Secondary | ICD-10-CM | POA: Diagnosis not present

## 2014-07-05 DIAGNOSIS — Z3483 Encounter for supervision of other normal pregnancy, third trimester: Secondary | ICD-10-CM

## 2014-07-05 DIAGNOSIS — Z3480 Encounter for supervision of other normal pregnancy, unspecified trimester: Secondary | ICD-10-CM

## 2014-07-05 NOTE — Progress Notes (Signed)
Doing well Flu shot today Some Braxton Hicks contractions being noted

## 2014-07-05 NOTE — Patient Instructions (Signed)
Third Trimester of Pregnancy The third trimester is from week 29 through week 42, months 7 through 9. The third trimester is a time when the fetus is growing rapidly. At the end of the ninth month, the fetus is about 20 inches in length and weighs 6-10 pounds.  BODY CHANGES Your body goes through many changes during pregnancy. The changes vary from woman to woman.   Your weight will continue to increase. You can expect to gain 25-35 pounds (11-16 kg) by the end of the pregnancy.  You may begin to get stretch marks on your hips, abdomen, and breasts.  You may urinate more often because the fetus is moving lower into your pelvis and pressing on your bladder.  You may develop or continue to have heartburn as a result of your pregnancy.  You may develop constipation because certain hormones are causing the muscles that push waste through your intestines to slow down.  You may develop hemorrhoids or swollen, bulging veins (varicose veins).  You may have pelvic pain because of the weight gain and pregnancy hormones relaxing your joints between the bones in your pelvis. Backaches may result from overexertion of the muscles supporting your posture.  You may have changes in your hair. These can include thickening of your hair, rapid growth, and changes in texture. Some women also have hair loss during or after pregnancy, or hair that feels dry or thin. Your hair will most likely return to normal after your baby is born.  Your breasts will continue to grow and be tender. A yellow discharge may leak from your breasts called colostrum.  Your belly button may stick out.  You may feel short of breath because of your expanding uterus.  You may notice the fetus "dropping," or moving lower in your abdomen.  You may have a bloody mucus discharge. This usually occurs a few days to a week before labor begins.  Your cervix becomes thin and soft (effaced) near your due date. WHAT TO EXPECT AT YOUR  PRENATAL EXAMS  You will have prenatal exams every 2 weeks until week 36. Then, you will have weekly prenatal exams. During a routine prenatal visit:  You will be weighed to make sure you and the fetus are growing normally.  Your blood pressure is taken.  Your abdomen will be measured to track your baby's growth.  The fetal heartbeat will be listened to.  Any test results from the previous visit will be discussed.  You may have a cervical check near your due date to see if you have effaced. At around 36 weeks, your caregiver will check your cervix. At the same time, your caregiver will also perform a test on the secretions of the vaginal tissue. This test is to determine if a type of bacteria, Group B streptococcus, is present. Your caregiver will explain this further. Your caregiver may ask you:  What your birth plan is.  How you are feeling.  If you are feeling the baby move.  If you have had any abnormal symptoms, such as leaking fluid, bleeding, severe headaches, or abdominal cramping.  If you have any questions. Other tests or screenings that may be performed during your third trimester include:  Blood tests that check for low iron levels (anemia).  Fetal testing to check the health, activity level, and growth of the fetus. Testing is done if you have certain medical conditions or if there are problems during the pregnancy. FALSE LABOR You may feel small, irregular contractions that   eventually go away. These are called Braxton Hicks contractions, or false labor. Contractions may last for hours, days, or even weeks before true labor sets in. If contractions come at regular intervals, intensify, or become painful, it is best to be seen by your caregiver.  SIGNS OF LABOR   Menstrual-like cramps.  Contractions that are 5 minutes apart or less.  Contractions that start on the top of the uterus and spread down to the lower abdomen and back.  A sense of increased pelvic  pressure or back pain.  A watery or bloody mucus discharge that comes from the vagina. If you have any of these signs before the 37th week of pregnancy, call your caregiver right away. You need to go to the hospital to get checked immediately. HOME CARE INSTRUCTIONS   Avoid all smoking, herbs, alcohol, and unprescribed drugs. These chemicals affect the formation and growth of the baby.  Follow your caregiver's instructions regarding medicine use. There are medicines that are either safe or unsafe to take during pregnancy.  Exercise only as directed by your caregiver. Experiencing uterine cramps is a good sign to stop exercising.  Continue to eat regular, healthy meals.  Wear a good support bra for breast tenderness.  Do not use hot tubs, steam rooms, or saunas.  Wear your seat belt at all times when driving.  Avoid raw meat, uncooked cheese, cat litter boxes, and soil used by cats. These carry germs that can cause birth defects in the baby.  Take your prenatal vitamins.  Try taking a stool softener (if your caregiver approves) if you develop constipation. Eat more high-fiber foods, such as fresh vegetables or fruit and whole grains. Drink plenty of fluids to keep your urine clear or pale yellow.  Take warm sitz baths to soothe any pain or discomfort caused by hemorrhoids. Use hemorrhoid cream if your caregiver approves.  If you develop varicose veins, wear support hose. Elevate your feet for 15 minutes, 3-4 times a day. Limit salt in your diet.  Avoid heavy lifting, wear low heal shoes, and practice good posture.  Rest a lot with your legs elevated if you have leg cramps or low back pain.  Visit your dentist if you have not gone during your pregnancy. Use a soft toothbrush to brush your teeth and be gentle when you floss.  A sexual relationship may be continued unless your caregiver directs you otherwise.  Do not travel far distances unless it is absolutely necessary and only  with the approval of your caregiver.  Take prenatal classes to understand, practice, and ask questions about the labor and delivery.  Make a trial run to the hospital.  Pack your hospital bag.  Prepare the baby's nursery.  Continue to go to all your prenatal visits as directed by your caregiver. SEEK MEDICAL CARE IF:  You are unsure if you are in labor or if your water has broken.  You have dizziness.  You have mild pelvic cramps, pelvic pressure, or nagging pain in your abdominal area.  You have persistent nausea, vomiting, or diarrhea.  You have a bad smelling vaginal discharge.  You have pain with urination. SEEK IMMEDIATE MEDICAL CARE IF:   You have a fever.  You are leaking fluid from your vagina.  You have spotting or bleeding from your vagina.  You have severe abdominal cramping or pain.  You have rapid weight loss or gain.  You have shortness of breath with chest pain.  You notice sudden or extreme swelling   of your face, hands, ankles, feet, or legs.  You have not felt your baby move in over an hour.  You have severe headaches that do not go away with medicine.  You have vision changes. Document Released: 09/24/2001 Document Revised: 10/05/2013 Document Reviewed: 12/01/2012 [employer] Patient Information 2015 [employer]. This information is not intended to replace advice given to you by your health care provider. Make sure you discuss any questions you have with your health care provider.  Breastfeeding Deciding to breastfeed is one of the best choices you can make for you and your baby. A change in hormones during pregnancy causes your breast tissue to grow and increases the number and size of your milk ducts. These hormones also allow proteins, sugars, and fats from your blood supply to make breast milk in your milk-producing glands. Hormones prevent breast milk from being released before your baby is born as well as prompt milk flow after birth. Once  breastfeeding has begun, thoughts of your baby, as well as his or her sucking or crying, can stimulate the release of milk from your milk-producing glands.  BENEFITS OF BREASTFEEDING For Your Baby  Your first milk (colostrum) helps your baby's digestive system function better.   There are antibodies in your milk that help your baby fight off infections.   Your baby has a lower incidence of asthma, allergies, and sudden infant death syndrome.   The nutrients in breast milk are better for your baby than infant formulas and are designed uniquely for your baby's needs.   Breast milk improves your baby's brain development.   Your baby is less likely to develop other conditions, such as childhood obesity, asthma, or type 2 diabetes mellitus.  For You   Breastfeeding helps to create a very special bond between you and your baby.   Breastfeeding is convenient. Breast milk is always available at the correct temperature and costs nothing.   Breastfeeding helps to burn calories and helps you lose the weight gained during pregnancy.   Breastfeeding makes your uterus contract to its prepregnancy size faster and slows bleeding (lochia) after you give birth.   Breastfeeding helps to lower your risk of developing type 2 diabetes mellitus, osteoporosis, and breast or ovarian cancer later in life. SIGNS THAT YOUR BABY IS HUNGRY Early Signs of Hunger  Increased alertness or activity.  Stretching.  Movement of the head from side to side.  Movement of the head and opening of the mouth when the corner of the mouth or cheek is stroked (rooting).  Increased sucking sounds, smacking lips, cooing, sighing, or squeaking.  Hand-to-mouth movements.  Increased sucking of fingers or hands. Late Signs of Hunger  Fussing.  Intermittent crying. Extreme Signs of Hunger Signs of extreme hunger will require calming and consoling before your baby will be able to breastfeed successfully. Do not  wait for the following signs of extreme hunger to occur before you initiate breastfeeding:   Restlessness.  A loud, strong cry.   Screaming. BREASTFEEDING BASICS Breastfeeding Initiation  Find a comfortable place to sit or lie down, with your neck and back well supported.  Place a pillow or rolled up blanket under your baby to bring him or her to the level of your breast (if you are seated). Nursing pillows are specially designed to help support your arms and your baby while you breastfeed.  Make sure that your baby's abdomen is facing your abdomen.   Gently massage your breast. With your fingertips, massage from your chest   wall toward your nipple in a circular motion. This encourages milk flow. You may need to continue this action during the feeding if your milk flows slowly.  Support your breast with 4 fingers underneath and your thumb above your nipple. Make sure your fingers are well away from your nipple and your baby's mouth.   Stroke your baby's lips gently with your finger or nipple.   When your baby's mouth is open wide enough, quickly bring your baby to your breast, placing your entire nipple and as much of the colored area around your nipple (areola) as possible into your baby's mouth.   More areola should be visible above your baby's upper lip than below the lower lip.   Your baby's tongue should be between his or her lower gum and your breast.   Ensure that your baby's mouth is correctly positioned around your nipple (latched). Your baby's lips should create a seal on your breast and be turned out (everted).  It is common for your baby to suck about 2-3 minutes in order to start the flow of breast milk. Latching Teaching your baby how to latch on to your breast properly is very important. An improper latch can cause nipple pain and decreased milk supply for you and poor weight gain in your baby. Also, if your baby is not latched onto your nipple properly, he or she  may swallow some air during feeding. This can make your baby fussy. Burping your baby when you switch breasts during the feeding can help to get rid of the air. However, teaching your baby to latch on properly is still the best way to prevent fussiness from swallowing air while breastfeeding. Signs that your baby has successfully latched on to your nipple:    Silent tugging or silent sucking, without causing you pain.   Swallowing heard between every 3-4 sucks.    Muscle movement above and in front of his or her ears while sucking.  Signs that your baby has not successfully latched on to nipple:   Sucking sounds or smacking sounds from your baby while breastfeeding.  Nipple pain. If you think your baby has not latched on correctly, slip your finger into the corner of your baby's mouth to break the suction and place it between your baby's gums. Attempt breastfeeding initiation again. Signs of Successful Breastfeeding Signs from your baby:   A gradual decrease in the number of sucks or complete cessation of sucking.   Falling asleep.   Relaxation of his or her body.   Retention of a small amount of milk in his or her mouth.   Letting go of your breast by himself or herself. Signs from you:  Breasts that have increased in firmness, weight, and size 1-3 hours after feeding.   Breasts that are softer immediately after breastfeeding.  Increased milk volume, as well as a change in milk consistency and color by the fifth day of breastfeeding.   Nipples that are not sore, cracked, or bleeding. Signs That Your Baby is Getting Enough Milk  Wetting at least 3 diapers in a 24-hour period. The urine should be clear and pale yellow by age 5 days.  At least 3 stools in a 24-hour period by age 5 days. The stool should be soft and yellow.  At least 3 stools in a 24-hour period by age 7 days. The stool should be seedy and yellow.  No loss of weight greater than 10% of birth weight  during the first 3   days of age.  Average weight gain of 4-7 ounces (113-198 g) per week after age 4 days.  Consistent daily weight gain by age 5 days, without weight loss after the age of 2 weeks. After a feeding, your baby may spit up a small amount. This is common. BREASTFEEDING FREQUENCY AND DURATION Frequent feeding will help you make more milk and can prevent sore nipples and breast engorgement. Breastfeed when you feel the need to reduce the fullness of your breasts or when your baby shows signs of hunger. This is called "breastfeeding on demand." Avoid introducing a pacifier to your baby while you are working to establish breastfeeding (the first 4-6 weeks after your baby is born). After this time you may choose to use a pacifier. Research has shown that pacifier use during the first year of a baby's life decreases the risk of sudden infant death syndrome (SIDS). Allow your baby to feed on each breast as long as he or she wants. Breastfeed until your baby is finished feeding. When your baby unlatches or falls asleep while feeding from the first breast, offer the second breast. Because newborns are often sleepy in the first few weeks of life, you may need to awaken your baby to get him or her to feed. Breastfeeding times will vary from baby to baby. However, the following rules can serve as a guide to help you ensure that your baby is properly fed:  Newborns (babies 4 weeks of age or younger) may breastfeed every 1-3 hours.  Newborns should not go longer than 3 hours during the day or 5 hours during the night without breastfeeding.  You should breastfeed your baby a minimum of 8 times in a 24-hour period until you begin to introduce solid foods to your baby at around 6 months of age. BREAST MILK PUMPING Pumping and storing breast milk allows you to ensure that your baby is exclusively fed your breast milk, even at times when you are unable to breastfeed. This is especially important if you are  going back to work while you are still breastfeeding or when you are not able to be present during feedings. Your lactation consultant can give you guidelines on how long it is safe to store breast milk.  A breast pump is a machine that allows you to pump milk from your breast into a sterile bottle. The pumped breast milk can then be stored in a refrigerator or freezer. Some breast pumps are operated by hand, while others use electricity. Ask your lactation consultant which type will work best for you. Breast pumps can be purchased, but some hospitals and breastfeeding support groups lease breast pumps on a monthly basis. A lactation consultant can teach you how to hand express breast milk, if you prefer not to use a pump.  CARING FOR YOUR BREASTS WHILE YOU BREASTFEED Nipples can become dry, cracked, and sore while breastfeeding. The following recommendations can help keep your breasts moisturized and healthy:  Avoid using soap on your nipples.   Wear a supportive bra. Although not required, special nursing bras and tank tops are designed to allow access to your breasts for breastfeeding without taking off your entire bra or top. Avoid wearing underwire-style bras or extremely tight bras.  Air dry your nipples for 3-4minutes after each feeding.   Use only cotton bra pads to absorb leaked breast milk. Leaking of breast milk between feedings is normal.   Use lanolin on your nipples after breastfeeding. Lanolin helps to maintain your skin's   normal moisture barrier. If you use pure lanolin, you do not need to wash it off before feeding your baby again. Pure lanolin is not toxic to your baby. You may also hand express a few drops of breast milk and gently massage that milk into your nipples and allow the milk to air dry. In the first few weeks after giving birth, some women experience extremely full breasts (engorgement). Engorgement can make your breasts feel heavy, warm, and tender to the touch.  Engorgement peaks within 3-5 days after you give birth. The following recommendations can help ease engorgement:  Completely empty your breasts while breastfeeding or pumping. You may want to start by applying warm, moist heat (in the shower or with warm water-soaked hand towels) just before feeding or pumping. This increases circulation and helps the milk flow. If your baby does not completely empty your breasts while breastfeeding, pump any extra milk after he or she is finished.  Wear a snug bra (nursing or regular) or tank top for 1-2 days to signal your body to slightly decrease milk production.  Apply ice packs to your breasts, unless this is too uncomfortable for you.  Make sure that your baby is latched on and positioned properly while breastfeeding. If engorgement persists after 48 hours of following these recommendations, contact your health care provider or a lactation consultant. OVERALL HEALTH CARE RECOMMENDATIONS WHILE BREASTFEEDING  Eat healthy foods. Alternate between meals and snacks, eating 3 of each per day. Because what you eat affects your breast milk, some of the foods may make your baby more irritable than usual. Avoid eating these foods if you are sure that they are negatively affecting your baby.  Drink milk, fruit juice, and water to satisfy your thirst (about 10 glasses a day).   Rest often, relax, and continue to take your prenatal vitamins to prevent fatigue, stress, and anemia.  Continue breast self-awareness checks.  Avoid chewing and smoking tobacco.  Avoid alcohol and drug use. Some medicines that may be harmful to your baby can pass through breast milk. It is important to ask your health care provider before taking any medicine, including all over-the-counter and prescription medicine as well as vitamin and herbal supplements. It is possible to become pregnant while breastfeeding. If birth control is desired, ask your health care provider about options that  will be safe for your baby. SEEK MEDICAL CARE IF:   You feel like you want to stop breastfeeding or have become frustrated with breastfeeding.  You have painful breasts or nipples.  Your nipples are cracked or bleeding.  Your breasts are red, tender, or warm.  You have a swollen area on either breast.  You have a fever or chills.  You have nausea or vomiting.  You have drainage other than breast milk from your nipples.  Your breasts do not become full before feedings by the fifth day after you give birth.  You feel sad and depressed.  Your baby is too sleepy to eat well.  Your baby is having trouble sleeping.   Your baby is wetting less than 3 diapers in a 24-hour period.  Your baby has less than 3 stools in a 24-hour period.  Your baby's skin or the white part of his or her eyes becomes yellow.   Your baby is not gaining weight by 5 days of age. SEEK IMMEDIATE MEDICAL CARE IF:   Your baby is overly tired (lethargic) and does not want to wake up and feed.  Your baby   develops an unexplained fever. Document Released: 09/30/2005 Document Revised: 10/05/2013 Document Reviewed: 03/24/2013 [employer] Patient Information 2015 [employer]. This information is not intended to replace advice given to you by your health care provider. Make sure you discuss any questions you have with your health care provider.  

## 2014-07-12 ENCOUNTER — Ambulatory Visit (INDEPENDENT_AMBULATORY_CARE_PROVIDER_SITE_OTHER): Payer: BC Managed Care – PPO | Admitting: Family Medicine

## 2014-07-12 ENCOUNTER — Encounter: Payer: Self-pay | Admitting: Family Medicine

## 2014-07-12 VITALS — BP 120/90 | HR 99 | Wt 157.0 lb

## 2014-07-12 DIAGNOSIS — Z3483 Encounter for supervision of other normal pregnancy, third trimester: Secondary | ICD-10-CM

## 2014-07-12 DIAGNOSIS — Z3480 Encounter for supervision of other normal pregnancy, unspecified trimester: Secondary | ICD-10-CM

## 2014-07-12 NOTE — Progress Notes (Signed)
GBS positive already Feeling some nausea labor precuations

## 2014-07-12 NOTE — Addendum Note (Signed)
Addended by: Barbara CowerNOGUES, Jamahl Lemmons L on: 07/12/2014 05:10 PM   Modules accepted: Orders

## 2014-07-12 NOTE — Patient Instructions (Signed)
Third Trimester of Pregnancy The third trimester is from week 29 through week 42, months 7 through 9. The third trimester is a time when the fetus is growing rapidly. At the end of the ninth month, the fetus is about 20 inches in length and weighs 6-10 pounds.  BODY CHANGES Your body goes through many changes during pregnancy. The changes vary from woman to woman.   Your weight will continue to increase. You can expect to gain 25-35 pounds (11-16 kg) by the end of the pregnancy.  You may begin to get stretch marks on your hips, abdomen, and breasts.  You may urinate more often because the fetus is moving lower into your pelvis and pressing on your bladder.  You may develop or continue to have heartburn as a result of your pregnancy.  You may develop constipation because certain hormones are causing the muscles that push waste through your intestines to slow down.  You may develop hemorrhoids or swollen, bulging veins (varicose veins).  You may have pelvic pain because of the weight gain and pregnancy hormones relaxing your joints between the bones in your pelvis. Backaches may result from overexertion of the muscles supporting your posture.  You may have changes in your hair. These can include thickening of your hair, rapid growth, and changes in texture. Some women also have hair loss during or after pregnancy, or hair that feels dry or thin. Your hair will most likely return to normal after your baby is born.  Your breasts will continue to grow and be tender. A yellow discharge may leak from your breasts called colostrum.  Your belly button may stick out.  You may feel short of breath because of your expanding uterus.  You may notice the fetus "dropping," or moving lower in your abdomen.  You may have a bloody mucus discharge. This usually occurs a few days to a week before labor begins.  Your cervix becomes thin and soft (effaced) near your due date. WHAT TO EXPECT AT YOUR  PRENATAL EXAMS  You will have prenatal exams every 2 weeks until week 36. Then, you will have weekly prenatal exams. During a routine prenatal visit:  You will be weighed to make sure you and the fetus are growing normally.  Your blood pressure is taken.  Your abdomen will be measured to track your baby's growth.  The fetal heartbeat will be listened to.  Any test results from the previous visit will be discussed.  You may have a cervical check near your due date to see if you have effaced. At around 36 weeks, your caregiver will check your cervix. At the same time, your caregiver will also perform a test on the secretions of the vaginal tissue. This test is to determine if a type of bacteria, Group B streptococcus, is present. Your caregiver will explain this further. Your caregiver may ask you:  What your birth plan is.  How you are feeling.  If you are feeling the baby move.  If you have had any abnormal symptoms, such as leaking fluid, bleeding, severe headaches, or abdominal cramping.  If you have any questions. Other tests or screenings that may be performed during your third trimester include:  Blood tests that check for low iron levels (anemia).  Fetal testing to check the health, activity level, and growth of the fetus. Testing is done if you have certain medical conditions or if there are problems during the pregnancy. FALSE LABOR You may feel small, irregular contractions that   eventually go away. These are called Braxton Hicks contractions, or false labor. Contractions may last for hours, days, or even weeks before true labor sets in. If contractions come at regular intervals, intensify, or become painful, it is best to be seen by your caregiver.  SIGNS OF LABOR   Menstrual-like cramps.  Contractions that are 5 minutes apart or less.  Contractions that start on the top of the uterus and spread down to the lower abdomen and back.  A sense of increased pelvic  pressure or back pain.  A watery or bloody mucus discharge that comes from the vagina. If you have any of these signs before the 37th week of pregnancy, call your caregiver right away. You need to go to the hospital to get checked immediately. HOME CARE INSTRUCTIONS   Avoid all smoking, herbs, alcohol, and unprescribed drugs. These chemicals affect the formation and growth of the baby.  Follow your caregiver's instructions regarding medicine use. There are medicines that are either safe or unsafe to take during pregnancy.  Exercise only as directed by your caregiver. Experiencing uterine cramps is a good sign to stop exercising.  Continue to eat regular, healthy meals.  Wear a good support bra for breast tenderness.  Do not use hot tubs, steam rooms, or saunas.  Wear your seat belt at all times when driving.  Avoid raw meat, uncooked cheese, cat litter boxes, and soil used by cats. These carry germs that can cause birth defects in the baby.  Take your prenatal vitamins.  Try taking a stool softener (if your caregiver approves) if you develop constipation. Eat more high-fiber foods, such as fresh vegetables or fruit and whole grains. Drink plenty of fluids to keep your urine clear or pale yellow.  Take warm sitz baths to soothe any pain or discomfort caused by hemorrhoids. Use hemorrhoid cream if your caregiver approves.  If you develop varicose veins, wear support hose. Elevate your feet for 15 minutes, 3-4 times a day. Limit salt in your diet.  Avoid heavy lifting, wear low heal shoes, and practice good posture.  Rest a lot with your legs elevated if you have leg cramps or low back pain.  Visit your dentist if you have not gone during your pregnancy. Use a soft toothbrush to brush your teeth and be gentle when you floss.  A sexual relationship may be continued unless your caregiver directs you otherwise.  Do not travel far distances unless it is absolutely necessary and only  with the approval of your caregiver.  Take prenatal classes to understand, practice, and ask questions about the labor and delivery.  Make a trial run to the hospital.  Pack your hospital bag.  Prepare the baby's nursery.  Continue to go to all your prenatal visits as directed by your caregiver. SEEK MEDICAL CARE IF:  You are unsure if you are in labor or if your water has broken.  You have dizziness.  You have mild pelvic cramps, pelvic pressure, or nagging pain in your abdominal area.  You have persistent nausea, vomiting, or diarrhea.  You have a bad smelling vaginal discharge.  You have pain with urination. SEEK IMMEDIATE MEDICAL CARE IF:   You have a fever.  You are leaking fluid from your vagina.  You have spotting or bleeding from your vagina.  You have severe abdominal cramping or pain.  You have rapid weight loss or gain.  You have shortness of breath with chest pain.  You notice sudden or extreme swelling   of your face, hands, ankles, feet, or legs.  You have not felt your baby move in over an hour.  You have severe headaches that do not go away with medicine.  You have vision changes. Document Released: 09/24/2001 Document Revised: 10/05/2013 Document Reviewed: 12/01/2012 [employer] Patient Information 2015 [employer]. This information is not intended to replace advice given to you by your health care provider. Make sure you discuss any questions you have with your health care provider.  Breastfeeding Deciding to breastfeed is one of the best choices you can make for you and your baby. A change in hormones during pregnancy causes your breast tissue to grow and increases the number and size of your milk ducts. These hormones also allow proteins, sugars, and fats from your blood supply to make breast milk in your milk-producing glands. Hormones prevent breast milk from being released before your baby is born as well as prompt milk flow after birth. Once  breastfeeding has begun, thoughts of your baby, as well as his or her sucking or crying, can stimulate the release of milk from your milk-producing glands.  BENEFITS OF BREASTFEEDING For Your Baby  Your first milk (colostrum) helps your baby's digestive system function better.   There are antibodies in your milk that help your baby fight off infections.   Your baby has a lower incidence of asthma, allergies, and sudden infant death syndrome.   The nutrients in breast milk are better for your baby than infant formulas and are designed uniquely for your baby's needs.   Breast milk improves your baby's brain development.   Your baby is less likely to develop other conditions, such as childhood obesity, asthma, or type 2 diabetes mellitus.  For You   Breastfeeding helps to create a very special bond between you and your baby.   Breastfeeding is convenient. Breast milk is always available at the correct temperature and costs nothing.   Breastfeeding helps to burn calories and helps you lose the weight gained during pregnancy.   Breastfeeding makes your uterus contract to its prepregnancy size faster and slows bleeding (lochia) after you give birth.   Breastfeeding helps to lower your risk of developing type 2 diabetes mellitus, osteoporosis, and breast or ovarian cancer later in life. SIGNS THAT YOUR BABY IS HUNGRY Early Signs of Hunger  Increased alertness or activity.  Stretching.  Movement of the head from side to side.  Movement of the head and opening of the mouth when the corner of the mouth or cheek is stroked (rooting).  Increased sucking sounds, smacking lips, cooing, sighing, or squeaking.  Hand-to-mouth movements.  Increased sucking of fingers or hands. Late Signs of Hunger  Fussing.  Intermittent crying. Extreme Signs of Hunger Signs of extreme hunger will require calming and consoling before your baby will be able to breastfeed successfully. Do not  wait for the following signs of extreme hunger to occur before you initiate breastfeeding:   Restlessness.  A loud, strong cry.   Screaming. BREASTFEEDING BASICS Breastfeeding Initiation  Find a comfortable place to sit or lie down, with your neck and back well supported.  Place a pillow or rolled up blanket under your baby to bring him or her to the level of your breast (if you are seated). Nursing pillows are specially designed to help support your arms and your baby while you breastfeed.  Make sure that your baby's abdomen is facing your abdomen.   Gently massage your breast. With your fingertips, massage from your chest   wall toward your nipple in a circular motion. This encourages milk flow. You may need to continue this action during the feeding if your milk flows slowly.  Support your breast with 4 fingers underneath and your thumb above your nipple. Make sure your fingers are well away from your nipple and your baby's mouth.   Stroke your baby's lips gently with your finger or nipple.   When your baby's mouth is open wide enough, quickly bring your baby to your breast, placing your entire nipple and as much of the colored area around your nipple (areola) as possible into your baby's mouth.   More areola should be visible above your baby's upper lip than below the lower lip.   Your baby's tongue should be between his or her lower gum and your breast.   Ensure that your baby's mouth is correctly positioned around your nipple (latched). Your baby's lips should create a seal on your breast and be turned out (everted).  It is common for your baby to suck about 2-3 minutes in order to start the flow of breast milk. Latching Teaching your baby how to latch on to your breast properly is very important. An improper latch can cause nipple pain and decreased milk supply for you and poor weight gain in your baby. Also, if your baby is not latched onto your nipple properly, he or she  may swallow some air during feeding. This can make your baby fussy. Burping your baby when you switch breasts during the feeding can help to get rid of the air. However, teaching your baby to latch on properly is still the best way to prevent fussiness from swallowing air while breastfeeding. Signs that your baby has successfully latched on to your nipple:    Silent tugging or silent sucking, without causing you pain.   Swallowing heard between every 3-4 sucks.    Muscle movement above and in front of his or her ears while sucking.  Signs that your baby has not successfully latched on to nipple:   Sucking sounds or smacking sounds from your baby while breastfeeding.  Nipple pain. If you think your baby has not latched on correctly, slip your finger into the corner of your baby's mouth to break the suction and place it between your baby's gums. Attempt breastfeeding initiation again. Signs of Successful Breastfeeding Signs from your baby:   A gradual decrease in the number of sucks or complete cessation of sucking.   Falling asleep.   Relaxation of his or her body.   Retention of a small amount of milk in his or her mouth.   Letting go of your breast by himself or herself. Signs from you:  Breasts that have increased in firmness, weight, and size 1-3 hours after feeding.   Breasts that are softer immediately after breastfeeding.  Increased milk volume, as well as a change in milk consistency and color by the fifth day of breastfeeding.   Nipples that are not sore, cracked, or bleeding. Signs That Your Baby is Getting Enough Milk  Wetting at least 3 diapers in a 24-hour period. The urine should be clear and pale yellow by age 5 days.  At least 3 stools in a 24-hour period by age 5 days. The stool should be soft and yellow.  At least 3 stools in a 24-hour period by age 7 days. The stool should be seedy and yellow.  No loss of weight greater than 10% of birth weight  during the first 3   days of age.  Average weight gain of 4-7 ounces (113-198 g) per week after age 4 days.  Consistent daily weight gain by age 5 days, without weight loss after the age of 2 weeks. After a feeding, your baby may spit up a small amount. This is common. BREASTFEEDING FREQUENCY AND DURATION Frequent feeding will help you make more milk and can prevent sore nipples and breast engorgement. Breastfeed when you feel the need to reduce the fullness of your breasts or when your baby shows signs of hunger. This is called "breastfeeding on demand." Avoid introducing a pacifier to your baby while you are working to establish breastfeeding (the first 4-6 weeks after your baby is born). After this time you may choose to use a pacifier. Research has shown that pacifier use during the first year of a baby's life decreases the risk of sudden infant death syndrome (SIDS). Allow your baby to feed on each breast as long as he or she wants. Breastfeed until your baby is finished feeding. When your baby unlatches or falls asleep while feeding from the first breast, offer the second breast. Because newborns are often sleepy in the first few weeks of life, you may need to awaken your baby to get him or her to feed. Breastfeeding times will vary from baby to baby. However, the following rules can serve as a guide to help you ensure that your baby is properly fed:  Newborns (babies 4 weeks of age or younger) may breastfeed every 1-3 hours.  Newborns should not go longer than 3 hours during the day or 5 hours during the night without breastfeeding.  You should breastfeed your baby a minimum of 8 times in a 24-hour period until you begin to introduce solid foods to your baby at around 6 months of age. BREAST MILK PUMPING Pumping and storing breast milk allows you to ensure that your baby is exclusively fed your breast milk, even at times when you are unable to breastfeed. This is especially important if you are  going back to work while you are still breastfeeding or when you are not able to be present during feedings. Your lactation consultant can give you guidelines on how long it is safe to store breast milk.  A breast pump is a machine that allows you to pump milk from your breast into a sterile bottle. The pumped breast milk can then be stored in a refrigerator or freezer. Some breast pumps are operated by hand, while others use electricity. Ask your lactation consultant which type will work best for you. Breast pumps can be purchased, but some hospitals and breastfeeding support groups lease breast pumps on a monthly basis. A lactation consultant can teach you how to hand express breast milk, if you prefer not to use a pump.  CARING FOR YOUR BREASTS WHILE YOU BREASTFEED Nipples can become dry, cracked, and sore while breastfeeding. The following recommendations can help keep your breasts moisturized and healthy:  Avoid using soap on your nipples.   Wear a supportive bra. Although not required, special nursing bras and tank tops are designed to allow access to your breasts for breastfeeding without taking off your entire bra or top. Avoid wearing underwire-style bras or extremely tight bras.  Air dry your nipples for 3-4minutes after each feeding.   Use only cotton bra pads to absorb leaked breast milk. Leaking of breast milk between feedings is normal.   Use lanolin on your nipples after breastfeeding. Lanolin helps to maintain your skin's   normal moisture barrier. If you use pure lanolin, you do not need to wash it off before feeding your baby again. Pure lanolin is not toxic to your baby. You may also hand express a few drops of breast milk and gently massage that milk into your nipples and allow the milk to air dry. In the first few weeks after giving birth, some women experience extremely full breasts (engorgement). Engorgement can make your breasts feel heavy, warm, and tender to the touch.  Engorgement peaks within 3-5 days after you give birth. The following recommendations can help ease engorgement:  Completely empty your breasts while breastfeeding or pumping. You may want to start by applying warm, moist heat (in the shower or with warm water-soaked hand towels) just before feeding or pumping. This increases circulation and helps the milk flow. If your baby does not completely empty your breasts while breastfeeding, pump any extra milk after he or she is finished.  Wear a snug bra (nursing or regular) or tank top for 1-2 days to signal your body to slightly decrease milk production.  Apply ice packs to your breasts, unless this is too uncomfortable for you.  Make sure that your baby is latched on and positioned properly while breastfeeding. If engorgement persists after 48 hours of following these recommendations, contact your health care provider or a lactation consultant. OVERALL HEALTH CARE RECOMMENDATIONS WHILE BREASTFEEDING  Eat healthy foods. Alternate between meals and snacks, eating 3 of each per day. Because what you eat affects your breast milk, some of the foods may make your baby more irritable than usual. Avoid eating these foods if you are sure that they are negatively affecting your baby.  Drink milk, fruit juice, and water to satisfy your thirst (about 10 glasses a day).   Rest often, relax, and continue to take your prenatal vitamins to prevent fatigue, stress, and anemia.  Continue breast self-awareness checks.  Avoid chewing and smoking tobacco.  Avoid alcohol and drug use. Some medicines that may be harmful to your baby can pass through breast milk. It is important to ask your health care provider before taking any medicine, including all over-the-counter and prescription medicine as well as vitamin and herbal supplements. It is possible to become pregnant while breastfeeding. If birth control is desired, ask your health care provider about options that  will be safe for your baby. SEEK MEDICAL CARE IF:   You feel like you want to stop breastfeeding or have become frustrated with breastfeeding.  You have painful breasts or nipples.  Your nipples are cracked or bleeding.  Your breasts are red, tender, or warm.  You have a swollen area on either breast.  You have a fever or chills.  You have nausea or vomiting.  You have drainage other than breast milk from your nipples.  Your breasts do not become full before feedings by the fifth day after you give birth.  You feel sad and depressed.  Your baby is too sleepy to eat well.  Your baby is having trouble sleeping.   Your baby is wetting less than 3 diapers in a 24-hour period.  Your baby has less than 3 stools in a 24-hour period.  Your baby's skin or the white part of his or her eyes becomes yellow.   Your baby is not gaining weight by 5 days of age. SEEK IMMEDIATE MEDICAL CARE IF:   Your baby is overly tired (lethargic) and does not want to wake up and feed.  Your baby   develops an unexplained fever. Document Released: 09/30/2005 Document Revised: 10/05/2013 Document Reviewed: 03/24/2013 [employer] Patient Information 2015 [employer]. This information is not intended to replace advice given to you by your health care provider. Make sure you discuss any questions you have with your health care provider.  

## 2014-07-19 ENCOUNTER — Ambulatory Visit (INDEPENDENT_AMBULATORY_CARE_PROVIDER_SITE_OTHER): Payer: BC Managed Care – PPO | Admitting: Family Medicine

## 2014-07-19 VITALS — BP 124/87 | HR 70 | Wt 161.0 lb

## 2014-07-19 DIAGNOSIS — Z36 Encounter for antenatal screening of mother: Secondary | ICD-10-CM | POA: Diagnosis not present

## 2014-07-19 DIAGNOSIS — Z3483 Encounter for supervision of other normal pregnancy, third trimester: Secondary | ICD-10-CM | POA: Diagnosis not present

## 2014-07-19 LAB — OB RESULTS CONSOLE GC/CHLAMYDIA
Chlamydia: NEGATIVE
Gonorrhea: NEGATIVE

## 2014-07-19 NOTE — Progress Notes (Signed)
Few contractions Labor precautions

## 2014-07-19 NOTE — Patient Instructions (Signed)
Breastfeeding Deciding to breastfeed is one of the best choices you can make for you and your baby. A change in hormones during pregnancy causes your breast tissue to grow and increases the number and size of your milk ducts. These hormones also allow proteins, sugars, and fats from your blood supply to make breast milk in your milk-producing glands. Hormones prevent breast milk from being released before your baby is born as well as prompt milk flow after birth. Once breastfeeding has begun, thoughts of your baby, as well as his or her sucking or crying, can stimulate the release of milk from your milk-producing glands.  BENEFITS OF BREASTFEEDING For Your Baby  Your first milk (colostrum) helps your baby's digestive system function better.   There are antibodies in your milk that help your baby fight off infections.   Your baby has a lower incidence of asthma, allergies, and sudden infant death syndrome.   The nutrients in breast milk are better for your baby than infant formulas and are designed uniquely for your baby's needs.   Breast milk improves your baby's brain development.   Your baby is less likely to develop other conditions, such as childhood obesity, asthma, or type 2 diabetes mellitus.  For You   Breastfeeding helps to create a very special bond between you and your baby.   Breastfeeding is convenient. Breast milk is always available at the correct temperature and costs nothing.   Breastfeeding helps to burn calories and helps you lose the weight gained during pregnancy.   Breastfeeding makes your uterus contract to its prepregnancy size faster and slows bleeding (lochia) after you give birth.   Breastfeeding helps to lower your risk of developing type 2 diabetes mellitus, osteoporosis, and breast or ovarian cancer later in life. SIGNS THAT YOUR BABY IS HUNGRY Early Signs of Hunger  Increased alertness or activity.  Stretching.  Movement of the head from  side to side.  Movement of the head and opening of the mouth when the corner of the mouth or cheek is stroked (rooting).  Increased sucking sounds, smacking lips, cooing, sighing, or squeaking.  Hand-to-mouth movements.  Increased sucking of fingers or hands. Late Signs of Hunger  Fussing.  Intermittent crying. Extreme Signs of Hunger Signs of extreme hunger will require calming and consoling before your baby will be able to breastfeed successfully. Do not wait for the following signs of extreme hunger to occur before you initiate breastfeeding:   Restlessness.  A loud, strong cry.   Screaming. BREASTFEEDING BASICS Breastfeeding Initiation  Find a comfortable place to sit or lie down, with your neck and back well supported.  Place a pillow or rolled up blanket under your baby to bring him or her to the level of your breast (if you are seated). Nursing pillows are specially designed to help support your arms and your baby while you breastfeed.  Make sure that your baby's abdomen is facing your abdomen.   Gently massage your breast. With your fingertips, massage from your chest wall toward your nipple in a circular motion. This encourages milk flow. You may need to continue this action during the feeding if your milk flows slowly.  Support your breast with 4 fingers underneath and your thumb above your nipple. Make sure your fingers are well away from your nipple and your baby's mouth.   Stroke your baby's lips gently with your finger or nipple.   When your baby's mouth is open wide enough, quickly bring your baby to your   breast, placing your entire nipple and as much of the colored area around your nipple (areola) as possible into your baby's mouth.   More areola should be visible above your baby's upper lip than below the lower lip.   Your baby's tongue should be between his or her lower gum and your breast.   Ensure that your baby's mouth is correctly positioned  around your nipple (latched). Your baby's lips should create a seal on your breast and be turned out (everted).  It is common for your baby to suck about 2-3 minutes in order to start the flow of breast milk. Latching Teaching your baby how to latch on to your breast properly is very important. An improper latch can cause nipple pain and decreased milk supply for you and poor weight gain in your baby. Also, if your baby is not latched onto your nipple properly, he or she may swallow some air during feeding. This can make your baby fussy. Burping your baby when you switch breasts during the feeding can help to get rid of the air. However, teaching your baby to latch on properly is still the best way to prevent fussiness from swallowing air while breastfeeding. Signs that your baby has successfully latched on to your nipple:    Silent tugging or silent sucking, without causing you pain.   Swallowing heard between every 3-4 sucks.    Muscle movement above and in front of his or her ears while sucking.  Signs that your baby has not successfully latched on to nipple:   Sucking sounds or smacking sounds from your baby while breastfeeding.  Nipple pain. If you think your baby has not latched on correctly, slip your finger into the corner of your baby's mouth to break the suction and place it between your baby's gums. Attempt breastfeeding initiation again. Signs of Successful Breastfeeding Signs from your baby:   A gradual decrease in the number of sucks or complete cessation of sucking.   Falling asleep.   Relaxation of his or her body.   Retention of a small amount of milk in his or her mouth.   Letting go of your breast by himself or herself. Signs from you:  Breasts that have increased in firmness, weight, and size 1-3 hours after feeding.   Breasts that are softer immediately after breastfeeding.  Increased milk volume, as well as a change in milk consistency and color by  the fifth day of breastfeeding.   Nipples that are not sore, cracked, or bleeding. Signs That Your Baby is Getting Enough Milk  Wetting at least 3 diapers in a 24-hour period. The urine should be clear and pale yellow by age 5 days.  At least 3 stools in a 24-hour period by age 5 days. The stool should be soft and yellow.  At least 3 stools in a 24-hour period by age 7 days. The stool should be seedy and yellow.  No loss of weight greater than 10% of birth weight during the first 3 days of age.  Average weight gain of 4-7 ounces (113-198 g) per week after age 4 days.  Consistent daily weight gain by age 5 days, without weight loss after the age of 2 weeks. After a feeding, your baby may spit up a small amount. This is common. BREASTFEEDING FREQUENCY AND DURATION Frequent feeding will help you make more milk and can prevent sore nipples and breast engorgement. Breastfeed when you feel the need to reduce the fullness of your breasts   or when your baby shows signs of hunger. This is called "breastfeeding on demand." Avoid introducing a pacifier to your baby while you are working to establish breastfeeding (the first 4-6 weeks after your baby is born). After this time you may choose to use a pacifier. Research has shown that pacifier use during the first year of a baby's life decreases the risk of sudden infant death syndrome (SIDS). Allow your baby to feed on each breast as long as he or she wants. Breastfeed until your baby is finished feeding. When your baby unlatches or falls asleep while feeding from the first breast, offer the second breast. Because newborns are often sleepy in the first few weeks of life, you may need to awaken your baby to get him or her to feed. Breastfeeding times will vary from baby to baby. However, the following rules can serve as a guide to help you ensure that your baby is properly fed:  Newborns (babies 4 weeks of age or younger) may breastfeed every 1-3  hours.  Newborns should not go longer than 3 hours during the day or 5 hours during the night without breastfeeding.  You should breastfeed your baby a minimum of 8 times in a 24-hour period until you begin to introduce solid foods to your baby at around 6 months of age. BREAST MILK PUMPING Pumping and storing breast milk allows you to ensure that your baby is exclusively fed your breast milk, even at times when you are unable to breastfeed. This is especially important if you are going back to work while you are still breastfeeding or when you are not able to be present during feedings. Your lactation consultant can give you guidelines on how long it is safe to store breast milk.  A breast pump is a machine that allows you to pump milk from your breast into a sterile bottle. The pumped breast milk can then be stored in a refrigerator or freezer. Some breast pumps are operated by hand, while others use electricity. Ask your lactation consultant which type will work best for you. Breast pumps can be purchased, but some hospitals and breastfeeding support groups lease breast pumps on a monthly basis. A lactation consultant can teach you how to hand express breast milk, if you prefer not to use a pump.  CARING FOR YOUR BREASTS WHILE YOU BREASTFEED Nipples can become dry, cracked, and sore while breastfeeding. The following recommendations can help keep your breasts moisturized and healthy:  Avoid using soap on your nipples.   Wear a supportive bra. Although not required, special nursing bras and tank tops are designed to allow access to your breasts for breastfeeding without taking off your entire bra or top. Avoid wearing underwire-style bras or extremely tight bras.  Air dry your nipples for 3-4minutes after each feeding.   Use only cotton bra pads to absorb leaked breast milk. Leaking of breast milk between feedings is normal.   Use lanolin on your nipples after breastfeeding. Lanolin helps to  maintain your skin's normal moisture barrier. If you use pure lanolin, you do not need to wash it off before feeding your baby again. Pure lanolin is not toxic to your baby. You may also hand express a few drops of breast milk and gently massage that milk into your nipples and allow the milk to air dry. In the first few weeks after giving birth, some women experience extremely full breasts (engorgement). Engorgement can make your breasts feel heavy, warm, and tender to the   touch. Engorgement peaks within 3-5 days after you give birth. The following recommendations can help ease engorgement:  Completely empty your breasts while breastfeeding or pumping. You may want to start by applying warm, moist heat (in the shower or with warm water-soaked hand towels) just before feeding or pumping. This increases circulation and helps the milk flow. If your baby does not completely empty your breasts while breastfeeding, pump any extra milk after he or she is finished.  Wear a snug bra (nursing or regular) or tank top for 1-2 days to signal your body to slightly decrease milk production.  Apply ice packs to your breasts, unless this is too uncomfortable for you.  Make sure that your baby is latched on and positioned properly while breastfeeding. If engorgement persists after 48 hours of following these recommendations, contact your health care provider or a lactation consultant. OVERALL HEALTH CARE RECOMMENDATIONS WHILE BREASTFEEDING  Eat healthy foods. Alternate between meals and snacks, eating 3 of each per day. Because what you eat affects your breast milk, some of the foods may make your baby more irritable than usual. Avoid eating these foods if you are sure that they are negatively affecting your baby.  Drink milk, fruit juice, and water to satisfy your thirst (about 10 glasses a day).   Rest often, relax, and continue to take your prenatal vitamins to prevent fatigue, stress, and anemia.  Continue  breast self-awareness checks.  Avoid chewing and smoking tobacco.  Avoid alcohol and drug use. Some medicines that may be harmful to your baby can pass through breast milk. It is important to ask your health care provider before taking any medicine, including all over-the-counter and prescription medicine as well as vitamin and herbal supplements. It is possible to become pregnant while breastfeeding. If birth control is desired, ask your health care provider about options that will be safe for your baby. SEEK MEDICAL CARE IF:   You feel like you want to stop breastfeeding or have become frustrated with breastfeeding.  You have painful breasts or nipples.  Your nipples are cracked or bleeding.  Your breasts are red, tender, or warm.  You have a swollen area on either breast.  You have a fever or chills.  You have nausea or vomiting.  You have drainage other than breast milk from your nipples.  Your breasts do not become full before feedings by the fifth day after you give birth.  You feel sad and depressed.  Your baby is too sleepy to eat well.  Your baby is having trouble sleeping.   Your baby is wetting less than 3 diapers in a 24-hour period.  Your baby has less than 3 stools in a 24-hour period.  Your baby's skin or the white part of his or her eyes becomes yellow.   Your baby is not gaining weight by 5 days of age. SEEK IMMEDIATE MEDICAL CARE IF:   Your baby is overly tired (lethargic) and does not want to wake up and feed.  Your baby develops an unexplained fever. Document Released: 09/30/2005 Document Revised: 10/05/2013 Document Reviewed: 03/24/2013 Exit[employer] Patient Information 2015 [employer]. This information is not intended to replace advice given to you by your health care provider. Make sure you discuss any questions you have with your health care provider.  

## 2014-07-20 LAB — GC/CHLAMYDIA PROBE AMP, URINE
Chlamydia, Swab/Urine, PCR: NEGATIVE
GC PROBE AMP, URINE: NEGATIVE

## 2014-07-21 ENCOUNTER — Encounter (HOSPITAL_COMMUNITY): Payer: Self-pay | Admitting: *Deleted

## 2014-07-21 ENCOUNTER — Inpatient Hospital Stay (HOSPITAL_COMMUNITY): Payer: BC Managed Care – PPO | Admitting: Anesthesiology

## 2014-07-21 ENCOUNTER — Encounter (HOSPITAL_COMMUNITY): Payer: BC Managed Care – PPO | Admitting: Anesthesiology

## 2014-07-21 ENCOUNTER — Inpatient Hospital Stay (HOSPITAL_COMMUNITY)
Admission: AD | Admit: 2014-07-21 | Discharge: 2014-07-23 | DRG: 775 | Disposition: A | Discharge status: Home / Self Care | Payer: BC Managed Care – PPO | Source: Ambulatory Visit | Attending: Obstetrics & Gynecology | Admitting: Obstetrics & Gynecology

## 2014-07-21 DIAGNOSIS — Z3A38 38 weeks gestation of pregnancy: Secondary | ICD-10-CM | POA: Diagnosis present

## 2014-07-21 DIAGNOSIS — O99613 Diseases of the digestive system complicating pregnancy, third trimester: Secondary | ICD-10-CM | POA: Diagnosis present

## 2014-07-21 DIAGNOSIS — K219 Gastro-esophageal reflux disease without esophagitis: Secondary | ICD-10-CM | POA: Diagnosis present

## 2014-07-21 DIAGNOSIS — O99824 Streptococcus B carrier state complicating childbirth: Secondary | ICD-10-CM | POA: Diagnosis present

## 2014-07-21 DIAGNOSIS — IMO0001 Reserved for inherently not codable concepts without codable children: Secondary | ICD-10-CM

## 2014-07-21 DIAGNOSIS — Z3403 Encounter for supervision of normal first pregnancy, third trimester: Secondary | ICD-10-CM | POA: Diagnosis present

## 2014-07-21 LAB — OB RESULTS CONSOLE GBS: GBS: POSITIVE

## 2014-07-21 LAB — CBC
HEMATOCRIT: 39.6 % (ref 36.0–46.0)
Hemoglobin: 13.6 g/dL (ref 12.0–15.0)
MCH: 31.9 pg (ref 26.0–34.0)
MCHC: 34.3 g/dL (ref 30.0–36.0)
MCV: 92.7 fL (ref 78.0–100.0)
PLATELETS: 225 10*3/uL (ref 150–400)
RBC: 4.27 MIL/uL (ref 3.87–5.11)
RDW: 13.3 % (ref 11.5–15.5)
WBC: 14.4 10*3/uL — ABNORMAL HIGH (ref 4.0–10.5)

## 2014-07-21 MED ORDER — LACTATED RINGERS IV SOLN: 500.0000 mL | INTRAVENOUS | Status: DC | PRN

## 2014-07-21 MED ORDER — FENTANYL 2.5 MCG/ML BUPIVACAINE 1/10 % EPIDURAL INFUSION (WH - ANES)
INTRAMUSCULAR | Status: DC | PRN
  Administered 2014-07-21: 14 mL/h via EPIDURAL

## 2014-07-21 MED ORDER — FLEET ENEMA 7-19 GM/118ML RE ENEM: 1.0000 | ENEMA | RECTAL | Status: DC | PRN

## 2014-07-21 MED ORDER — DIPHENHYDRAMINE HCL 50 MG/ML IJ SOLN: 12.5000 mg | INTRAMUSCULAR | Status: DC | PRN

## 2014-07-21 MED ORDER — FENTANYL CITRATE 0.05 MG/ML IJ SOLN: 50.0000 ug | INTRAMUSCULAR | Status: DC | PRN

## 2014-07-21 MED ORDER — OXYCODONE-ACETAMINOPHEN 5-325 MG PO TABS: 2.0000 | ORAL_TABLET | ORAL | Status: DC | PRN

## 2014-07-21 MED ORDER — OXYTOCIN 40 UNITS IN LACTATED RINGERS INFUSION - SIMPLE MED
62.5000 mL/h | INTRAVENOUS | Status: DC
  Filled 2014-07-21: qty 1000

## 2014-07-21 MED ORDER — PENICILLIN G POTASSIUM 5000000 UNITS IJ SOLR
2.5000 10*6.[IU] | INTRAVENOUS | Status: DC
  Administered 2014-07-22: 2.5 10*6.[IU] via INTRAVENOUS
  Filled 2014-07-21 (×5): qty 2.5

## 2014-07-21 MED ORDER — PHENYLEPHRINE 40 MCG/ML (10ML) SYRINGE FOR IV PUSH (FOR BLOOD PRESSURE SUPPORT)
80.0000 ug | PREFILLED_SYRINGE | INTRAVENOUS | Status: DC | PRN
  Filled 2014-07-21: qty 2
  Filled 2014-07-21: qty 10

## 2014-07-21 MED ORDER — FENTANYL 2.5 MCG/ML BUPIVACAINE 1/10 % EPIDURAL INFUSION (WH - ANES)
14.0000 mL/h | INTRAMUSCULAR | Status: DC | PRN
  Administered 2014-07-21 – 2014-07-22 (×2): 14 mL/h via EPIDURAL
  Filled 2014-07-21 (×2): qty 125

## 2014-07-21 MED ORDER — ACETAMINOPHEN 325 MG PO TABS: 650.0000 mg | ORAL_TABLET | ORAL | Status: DC | PRN

## 2014-07-21 MED ORDER — CITRIC ACID-SODIUM CITRATE 334-500 MG/5ML PO SOLN: 30.0000 mL | ORAL | Status: DC | PRN

## 2014-07-21 MED ORDER — LIDOCAINE HCL (PF) 1 % IJ SOLN
INTRAMUSCULAR | Status: DC | PRN
  Administered 2014-07-21: 4 mL
  Administered 2014-07-21: 5 mL

## 2014-07-21 MED ORDER — OXYCODONE-ACETAMINOPHEN 5-325 MG PO TABS: 1.0000 | ORAL_TABLET | ORAL | Status: DC | PRN

## 2014-07-21 MED ORDER — EPHEDRINE 5 MG/ML INJ
10.0000 mg | INTRAVENOUS | Status: DC | PRN
  Filled 2014-07-21: qty 2

## 2014-07-21 MED ORDER — PHENYLEPHRINE 40 MCG/ML (10ML) SYRINGE FOR IV PUSH (FOR BLOOD PRESSURE SUPPORT)
80.0000 ug | PREFILLED_SYRINGE | INTRAVENOUS | Status: DC | PRN
  Filled 2014-07-21: qty 2

## 2014-07-21 MED ORDER — LACTATED RINGERS IV SOLN: INTRAVENOUS | Status: DC

## 2014-07-21 MED ORDER — ONDANSETRON HCL 4 MG/2ML IJ SOLN
4.0000 mg | Freq: Four times a day (QID) | INTRAMUSCULAR | Status: DC | PRN
  Administered 2014-07-21: 4 mg via INTRAVENOUS
  Filled 2014-07-21: qty 2

## 2014-07-21 MED ORDER — LACTATED RINGERS IV SOLN: 500.0000 mL | Freq: Once | INTRAVENOUS | Status: DC

## 2014-07-21 MED ORDER — PENICILLIN G POTASSIUM 5000000 UNITS IJ SOLR
5.0000 10*6.[IU] | Freq: Once | INTRAVENOUS | Status: AC
  Administered 2014-07-21: 5 10*6.[IU] via INTRAVENOUS
  Filled 2014-07-21: qty 5

## 2014-07-21 MED ORDER — OXYTOCIN BOLUS FROM INFUSION
500.0000 mL | INTRAVENOUS | Status: DC
  Administered 2014-07-22: 500 mL via INTRAVENOUS

## 2014-07-21 MED ORDER — LIDOCAINE HCL (PF) 1 % IJ SOLN
30.0000 mL | INTRAMUSCULAR | Status: DC | PRN
  Filled 2014-07-21: qty 30

## 2014-07-21 NOTE — MAU Note (Signed)
Patient presents to MAU with c/o contractions every 3 minutes. Denies LOF or VB at this time. +FM. States was 2cm/50% at Tuesday office visit.

## 2014-07-21 NOTE — Anesthesia Preprocedure Evaluation (Signed)
Anesthesia Evaluation  Patient identified by MRN, date of birth, ID band Patient awake    Reviewed: Allergy & Precautions, H&P , Patient's Chart, lab work & pertinent test results  Airway Mallampati: II TM Distance: >3 FB Neck ROM: Full    Dental no notable dental hx. (+) Teeth Intact   Pulmonary neg pulmonary ROS,  breath sounds clear to auscultation  Pulmonary exam normal       Cardiovascular negative cardio ROS  Rhythm:Regular Rate:Normal     Neuro/Psych  Headaches, Anxiety    GI/Hepatic Neg liver ROS, GERD-  ,  Endo/Other  negative endocrine ROS  Renal/GU negative Renal ROS  negative genitourinary   Musculoskeletal negative musculoskeletal ROS (+)   Abdominal   Peds  Hematology negative hematology ROS (+)   Anesthesia Other Findings   Reproductive/Obstetrics (+) Pregnancy                           Anesthesia Physical Anesthesia Plan  ASA: II  Anesthesia Plan: Epidural   Post-op Pain Management:    Induction:   Airway Management Planned: Natural Airway  Additional Equipment:   Intra-op Plan:   Post-operative Plan:   Informed Consent: I have reviewed the patients History and Physical, chart, labs and discussed the procedure including the risks, benefits and alternatives for the proposed anesthesia with the patient or authorized representative who has indicated his/her understanding and acceptance.     Plan Discussed with: Anesthesiologist  Anesthesia Plan Comments:         Anesthesia Quick Evaluation

## 2014-07-21 NOTE — H&P (Signed)
Leah ChristenMichelle Channing Hahn is a 23 y.o. female G1P0 with IUP at 5269w0d presenting for contractions. Pt states she has been having regular, every 5 minutes contractions, associated with none vaginal bleeding.  Membranes are intact, with active fetal movement.   [facility] 8 w[facility]  Prenatal History/Complications:  none  Past Medical History: Past Medical History  Diagnosis Date  . Supervision of other normal pregnancy 12/28/2013     [facility]  Dating 1st trimester sono equal to LMP    Genetic Screen 1 Screen:    normal            AFP:  WNL                                   Anatomic US WNL  GTT Third trimester:   CF screen Negative  TDaP vaccine   Flu vaccine   GBS Positive  Contraception   [employer] breast  Circumcision   Pediatrician   Support Person      . Medical history non-contributory     Past Surgical History: Past Surgical History  Procedure Laterality Date  . Knee surgery Left 1992  . Wisdom tooth extraction    . No past surgeries      Obstetrical History: OB History   Grav Para Term Preterm Abortions TAB SAB Ect Mult Living   1                Social History: History   Social History  . Marital Status: Single    Spouse Name: N/A    Number of Children: N/A  . Years of Education: N/A   Social History Main Topics  . Smoking status: Never Smoker   . Smokeless tobacco: Never Used  . Alcohol Use: No  . Drug Use: No  . Sexual Activity: Yes    Birth Control/ Protection: None   Other Topics Concern  . Not on file   Social History Narrative  . No narrative on file    Family History: Family History  Problem Relation Age of Onset  . Anemia Mother   . Anemia Maternal Grandfather   . Cancer Maternal Grandfather     COLON/LIVER    Allergies: No Known Allergies  Prescriptions prior to admission  Medication Sig Dispense Refill  . Prenatal Vit-Fe Fumarate-FA (PRENATAL MULTIVITAMIN) TABS tablet Take 1 tablet by mouth daily.      .  Misc. Devices (BREAST PUMP) MISC Dispense one breast pump for patient  1 each  0     Prenatal Transfer Tool  Maternal Diabetes: No Genetic Screening: Normal Maternal Ultrasounds/Referrals: Normal Fetal Ultrasounds or other Referrals:  None Maternal Substance Abuse:  No Significant Maternal Medications:  None Significant Maternal Lab Results: Lab values include: Group B Strep positive     Review of Systems   Constitutional: Negative for fever, chills, weight loss, malaise/fatigue and diaphoresis.  HENT: Negative for hearing loss, ear pain, nosebleeds, congestion, sore throat, neck pain, tinnitus and ear discharge.   Eyes: Negative for blurred vision, double vision, photophobia, pain, discharge and redness.  Respiratory: Negative for cough, hemoptysis, sputum production, shortness of breath, wheezing and stridor.   Cardiovascular: Negative for chest pain, palpitations, orthopnea,  leg swelling  Gastrointestinal: Positive for abdominal pain. Negative for heartburn, nausea, vomiting, diarrhea, constipation, blood in stool Genitourinary: Negative for dysuria, urgency, frequency, hematuria and flank pain.  Musculoskeletal: Negative for myalgias,  back pain, joint pain and falls.  Skin: Negative for itching and rash.  Neurological: Negative for dizziness, tingling, tremors, sensory change, speech change, focal weakness, seizures, loss of consciousness, weakness and headaches.  Endo/Heme/Allergies: Negative for environmental allergies and polydipsia. Does not bruise/bleed easily.  Psychiatric/Behavioral: Negative for depression, suicidal ideas, hallucinations, memory loss and substance abuse. The patient is not nervous/anxious and does not have insomnia.       Last menstrual period 10/16/2013. General appearance: alert, cooperative and no distress Lungs: clear to auscultation bilaterally Heart: regular rate and rhythm Abdomen: soft, non-tender; bowel sounds normal Extremities: Homans  sign is negative, no sign of DVT DTR's 2+ Presentation: cephalic Fetal monitoringBaseline: 140 bpm, Variability: Good {> 6 bpm), Accelerations: Reactive and Decelerations: Absent Uterine activity  Moderate ctx q 2-3 minutes  Dilation: 6.5 Effacement (%): 90 Station: -1 Exam by:: Elonda Husky    Prenatal labs: ABO, Rh: A/POS/-- (03/11 1341) Antibody: NEG (03/11 1341) Rubella:  immune RPR: NON REAC (08/04 0917)  HBsAg: NEGATIVE (03/11 1341)  HIV: NONREACTIVE (08/04 0917)  GBS:   POSITIVE 1 hr Glucola 115 Genetic screening  normal Anatomy US normal   No results found for this or any previous visit (from the past 24 hour(s)).  Assessment: Leah Hahn is a 23 y.o. G1P0 with an IUP at [redacted]w[redacted]d presenting for active labor  Plan: #Labor: expectant management #Pain:  IV/epidural #FWB Cat 1 #ID: GBS: Positive:  Start prophylaxis  #MOF:  breast #MOC: POP's #Circ:  yes   CRESENZO-DISHMAN,Mikela Senn 07/21/2014, 8:14 PM

## 2014-07-21 NOTE — H&P (Signed)
Attestation of Attending Supervision of Advanced Practitioner (CNM/NP): Evaluation and management procedures were performed by the Advanced Practitioner under my supervision and collaboration.  I have reviewed the Advanced Practitioner's note and chart, and I agree with the management and plan.  HARRAWAY-SMITH, Zamia Tyminski 10:20 PM     

## 2014-07-21 NOTE — Anesthesia Procedure Notes (Signed)
Epidural Patient location during procedure: OB Start time: 07/21/2014 9:15 PM  Preanesthetic Checklist Completed: patient identified, site marked, surgical consent, pre-op evaluation, timeout performed, IV checked, risks and benefits discussed and monitors and equipment checked  Epidural Patient position: sitting Prep: site prepped and draped and DuraPrep Patient monitoring: continuous pulse ox and blood pressure Approach: midline Location: L3-L4 Injection technique: LOR air  Needle:  Needle type: Tuohy  Needle gauge: 17 G Needle length: 9 cm and 9 Needle insertion depth: 5 cm cm Catheter type: closed end flexible Catheter size: 19 Gauge Catheter at skin depth: 10 cm Test dose: negative and Other  Assessment Events: blood not aspirated, injection not painful, no injection resistance, negative IV test and no paresthesia  Additional Notes Patient identified. Risks and benefits discussed including failed block, incomplete  Pain control, post dural puncture headache, nerve damage, paralysis, blood pressure Changes, nausea, vomiting, reactions to medications-both toxic and allergic and post Partum back pain. All questions were answered. Patient expressed understanding and wished to proceed. Sterile technique was used throughout procedure. Epidural site was Dressed with sterile barrier dressing. No paresthesias, signs of intravascular injection Or signs of intrathecal spread were encountered.  Patient was more comfortable after the epidural was dosed. Please see RN's note for documentation of vital signs and FHR which are stable.

## 2014-07-22 ENCOUNTER — Encounter (HOSPITAL_COMMUNITY): Payer: Self-pay | Admitting: *Deleted

## 2014-07-22 DIAGNOSIS — Z3A38 38 weeks gestation of pregnancy: Secondary | ICD-10-CM

## 2014-07-22 LAB — RPR

## 2014-07-22 LAB — CBC
HCT: 32.3 % — ABNORMAL LOW (ref 36.0–46.0)
HEMOGLOBIN: 11.2 g/dL — AB (ref 12.0–15.0)
MCH: 32.2 pg (ref 26.0–34.0)
MCHC: 34.7 g/dL (ref 30.0–36.0)
MCV: 92.8 fL (ref 78.0–100.0)
Platelets: 176 10*3/uL (ref 150–400)
RBC: 3.48 MIL/uL — AB (ref 3.87–5.11)
RDW: 13.3 % (ref 11.5–15.5)
WBC: 14.9 10*3/uL — ABNORMAL HIGH (ref 4.0–10.5)

## 2014-07-22 LAB — HIV ANTIBODY (ROUTINE TESTING W REFLEX): HIV: NONREACTIVE

## 2014-07-22 MED ORDER — PRENATAL MULTIVITAMIN CH
1.0000 | ORAL_TABLET | Freq: Every day | ORAL | Status: DC
  Administered 2014-07-22 – 2014-07-23 (×2): 1 via ORAL
  Filled 2014-07-22 (×2): qty 1

## 2014-07-22 MED ORDER — ZOLPIDEM TARTRATE 5 MG PO TABS: 5.0000 mg | ORAL_TABLET | Freq: Every evening | ORAL | Status: DC | PRN

## 2014-07-22 MED ORDER — LANOLIN HYDROUS EX OINT
TOPICAL_OINTMENT | CUTANEOUS | Status: DC | PRN
Start: 2014-07-22 — End: 2014-07-23

## 2014-07-22 MED ORDER — OXYCODONE-ACETAMINOPHEN 5-325 MG PO TABS: 1.0000 | ORAL_TABLET | ORAL | Status: DC | PRN

## 2014-07-22 MED ORDER — IBUPROFEN 600 MG PO TABS
600.0000 mg | ORAL_TABLET | Freq: Four times a day (QID) | ORAL | Status: DC
  Administered 2014-07-22 – 2014-07-23 (×6): 600 mg via ORAL
  Filled 2014-07-22 (×6): qty 1

## 2014-07-22 MED ORDER — DIBUCAINE 1 % RE OINT: 1.0000 "application " | TOPICAL_OINTMENT | RECTAL | Status: DC | PRN

## 2014-07-22 MED ORDER — TETANUS-DIPHTH-ACELL PERTUSSIS 5-2.5-18.5 LF-MCG/0.5 IM SUSP: 0.5000 mL | Freq: Once | INTRAMUSCULAR | Status: DC

## 2014-07-22 MED ORDER — WITCH HAZEL-GLYCERIN EX PADS: 1.0000 "application " | MEDICATED_PAD | CUTANEOUS | Status: DC | PRN

## 2014-07-22 MED ORDER — ONDANSETRON HCL 4 MG PO TABS: 4.0000 mg | ORAL_TABLET | ORAL | Status: DC | PRN

## 2014-07-22 MED ORDER — BENZOCAINE-MENTHOL 20-0.5 % EX AERO
1.0000 "application " | INHALATION_SPRAY | CUTANEOUS | Status: DC | PRN
  Administered 2014-07-22: 1 via TOPICAL
  Filled 2014-07-22: qty 56

## 2014-07-22 MED ORDER — SIMETHICONE 80 MG PO CHEW: 80.0000 mg | CHEWABLE_TABLET | ORAL | Status: DC | PRN

## 2014-07-22 MED ORDER — ONDANSETRON HCL 4 MG/2ML IJ SOLN: 4.0000 mg | INTRAMUSCULAR | Status: DC | PRN

## 2014-07-22 MED ORDER — OXYCODONE-ACETAMINOPHEN 5-325 MG PO TABS: 2.0000 | ORAL_TABLET | ORAL | Status: DC | PRN

## 2014-07-22 MED ORDER — DIPHENHYDRAMINE HCL 25 MG PO CAPS: 25.0000 mg | ORAL_CAPSULE | Freq: Four times a day (QID) | ORAL | Status: DC | PRN

## 2014-07-22 MED ORDER — SENNOSIDES-DOCUSATE SODIUM 8.6-50 MG PO TABS
2.0000 | ORAL_TABLET | ORAL | Status: DC
  Administered 2014-07-22: 2 via ORAL
  Filled 2014-07-22: qty 2

## 2014-07-22 NOTE — Anesthesia Postprocedure Evaluation (Signed)
  Anesthesia Post-op Note  Patient: Donna ChristenMichelle Channing O'Ferrell  Procedure(s) Performed: * No procedures listed *  Patient Location: Mother/Baby  Anesthesia Type:Epidural  Level of Consciousness: awake  Airway and Oxygen Therapy: Patient Spontanous Breathing  Post-op Pain: none  Post-op Assessment: Post-op Vital signs reviewed, Patient's Cardiovascular Status Stable, Respiratory Function Stable, Patent Airway, No signs of Nausea or vomiting, Adequate PO intake, Pain level controlled, No headache, No backache, No residual numbness and No residual motor weakness  Post-op Vital Signs: Reviewed and stable  Last Vitals:  Filed Vitals:   07/22/14 0656  BP: 107/56  Pulse: 83  Temp: 36.8 C  Resp: 18    Complications: No apparent anesthesia complications

## 2014-07-22 NOTE — Lactation Note (Signed)
This note was copied from the chart of Leah Marcelino DusterMichelle O'Ferrell. Lactation Consultation Note     Initial consult with this mom of a term baby, now 3910 hours old. I assisted mom with latching baby in cross cradle hold. He latched easily, with strong suckles. Mom has lots of easily expressed colostrum. Teaching done from baby and me book, and on lactation services. Mom know to call for questions/concerns.  Patient Name: Leah Marcelino DusterMichelle O'Ferrell ZOXWR'UToday's Date: 07/22/2014 Reason for consult: Initial assessment   Maternal Data Formula Feeding for Exclusion: No Has patient been taught Hand Expression?: Yes Does the patient have breastfeeding experience prior to this delivery?: No  Feeding Feeding Type: Breast Fed Length of feed: 9 min  LATCH Score/Interventions Latch: Grasps breast easily, tongue down, lips flanged, rhythmical sucking.  Audible Swallowing: A few with stimulation Intervention(s): Hand expression  Type of Nipple: Everted at rest and after stimulation  Comfort (Breast/Nipple): Soft / non-tender     Hold (Positioning): Assistance needed to correctly position infant at breast and maintain latch. Intervention(s): Breastfeeding basics reviewed;Support Pillows;Position options;Skin to skin  LATCH Score: 8  Lactation Tools Discussed/Used     Consult Status Consult Status: Follow-up Date: 07/23/14 Follow-up type: In-patient    Alfred LevinsLee, Lasalle Abee Anne 07/22/2014, 1:44 PM

## 2014-07-23 MED ORDER — IBUPROFEN 600 MG PO TABS: 600.0000 mg | ORAL_TABLET | Freq: Four times a day (QID) | ORAL | Status: DC

## 2014-07-23 MED ORDER — NORETHINDRONE 0.35 MG PO TABS: 1.0000 | ORAL_TABLET | Freq: Every day | ORAL | Status: DC

## 2014-07-23 MED ORDER — DOCUSATE SODIUM 100 MG PO CAPS: 100.0000 mg | ORAL_CAPSULE | Freq: Two times a day (BID) | ORAL | Status: DC

## 2014-07-23 NOTE — Lactation Note (Signed)
This note was copied from the chart of Leah Marcelino DusterMichelle O'Ferrell. Lactation Consultation Note  P1, Discussed various positions with parents. Baby latched in cross cradle position.  Rhythmical sucks and swallows observed. Reviewed massage to keep him active. Suggested waking techniques and STS so he breastfeeds longer than 10 min. Mom encouraged to feed baby 8-12 times/24 hours and with feeding cues.  Provided comfort gels, hand pump and reviewed use.  Discussed engorgement care.  Patient Name: Jac CanavanBoy Elinore O'Ferrell ZOXWR'UToday's Date: 07/23/2014 Reason for consult: Follow-up assessment   Maternal Data    Feeding Feeding Type: Breast Fed Length of feed: 10 min  LATCH Score/Interventions Latch: Grasps breast easily, tongue down, lips flanged, rhythmical sucking.  Audible Swallowing: Spontaneous and intermittent Intervention(s): Alternate breast massage;Hand expression;Skin to skin  Type of Nipple: Everted at rest and after stimulation  Comfort (Breast/Nipple): Filling, red/small blisters or bruises, mild/mod discomfort  Problem noted: Mild/Moderate discomfort Interventions (Mild/moderate discomfort): Hand expression;Hand massage;Comfort gels  Hold (Positioning): Assistance needed to correctly position infant at breast and maintain latch.  LATCH Score: 8  Lactation Tools Discussed/Used     Consult Status Consult Status: Follow-up Date: 07/24/14 Follow-up type: In-patient    Dahlia ByesBerkelhammer, Ruth [facility] 07/23/2014, 11:17 AM

## 2014-07-23 NOTE — Discharge Summary (Signed)
Obstetric Discharge Summary Reason for Admission: onset of labor Prenatal Procedures: none Intrapartum Procedures: spontaneous vaginal delivery and GBS prophylaxis Postpartum Procedures: none Complications-Operative and Postpartum: superficial labial laceration Hemoglobin  Date Value Ref Range Status  07/22/2014 11.2* 12.0 - 15.0 g/dL Final     REPEATED TO VERIFY     DELTA CHECK NOTED     HCT  Date Value Ref Range Status  07/22/2014 32.3* 36.0 - 46.0 % Final    Physical Exam:  General: alert, cooperative and no distress Lochia: appropriate Uterine Fundus: firm DVT Evaluation: No evidence of DVT seen on physical exam. Negative Homan's sign. No cords or calf tenderness. No significant calf/ankle edema.  Discharge Diagnoses: Term Pregnancy-delivered  Discharge Information: Date: 07/23/2014 Activity: pelvic rest Diet: routine Medications: PNV, Ibuprofen and Colace Condition: stable Instructions: refer to practice specific booklet Discharge to: home Follow-up Information   [facility]   Specialty:  Obstetrics and Gynecology   Contact information:   [address] [place] [id number] [phone]      Newborn Data: Live born female  Birth Weight: 7 lb 4.9 oz (3315 g) APGAR: 9, 9  Home with mother pending pediatrician. Start micronor in 4 weeks.  Tawni CarnesWight, Andrew 07/23/2014, 7:45 AM  I have seen and examined this patient and I agree with the above. Cam HaiSHAW, Bryar Dahms CNM 7:59 AM 07/23/2014

## 2014-07-23 NOTE — Discharge Instructions (Signed)
Start the micronor (progestin only pill) on 11/1. Make sure to take this at the same time every day (this is different than the combined hormone pill, which timing is a little less important).

## 2014-07-28 ENCOUNTER — Encounter: Payer: BC Managed Care – PPO | Admitting: Obstetrics and Gynecology

## 2014-08-02 ENCOUNTER — Other Ambulatory Visit: Payer: BC Managed Care – PPO | Admitting: *Deleted

## 2014-08-02 NOTE — Progress Notes (Signed)
Patient is having a migraine headache and wanted to be sure that it was not her blood pressure.  Her BP is normal today.  She will utilize her ibuprofen and tylenol that she has at home and she will call us back if her headache persists for follow up or alternative options.

## 2014-08-15 ENCOUNTER — Encounter (HOSPITAL_COMMUNITY): Payer: Self-pay | Admitting: *Deleted

## 2014-08-28 IMAGING — US US OB COMP +14 WK
1 series · 12 of 28 positions shown · non-contrast
Comparison: none

[Series 1: us ob comp +14 wk · 97 acquisitions, 12 frames shown]
[im 4/97]
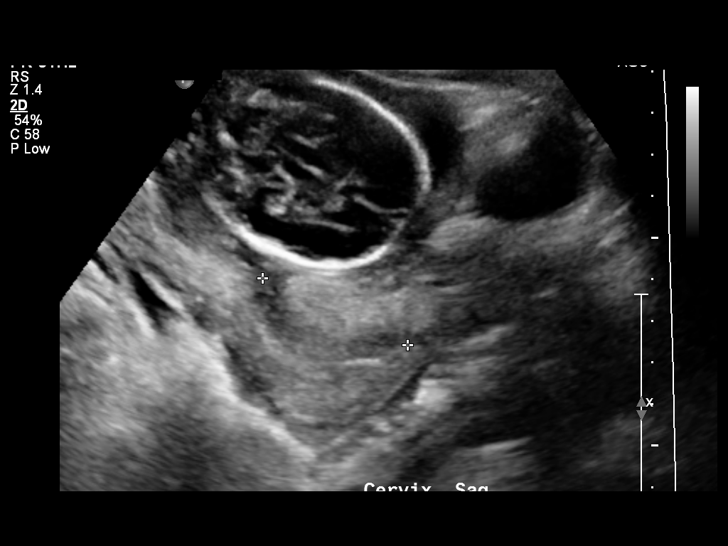
[im 11/97]
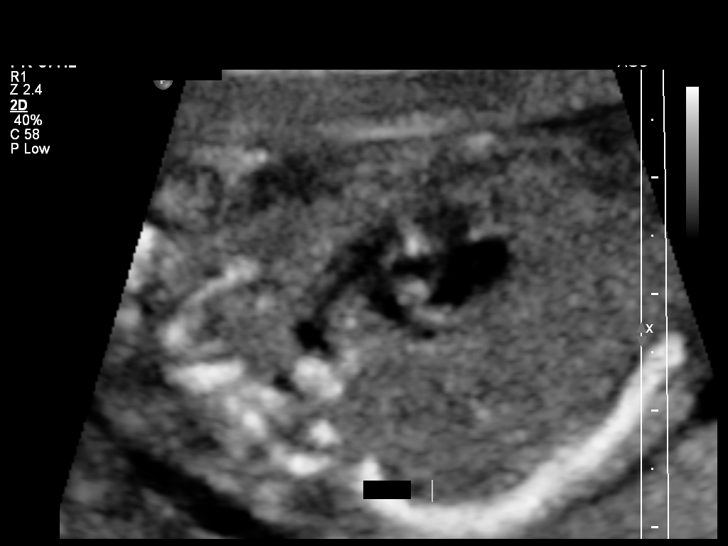
[im 18/97]
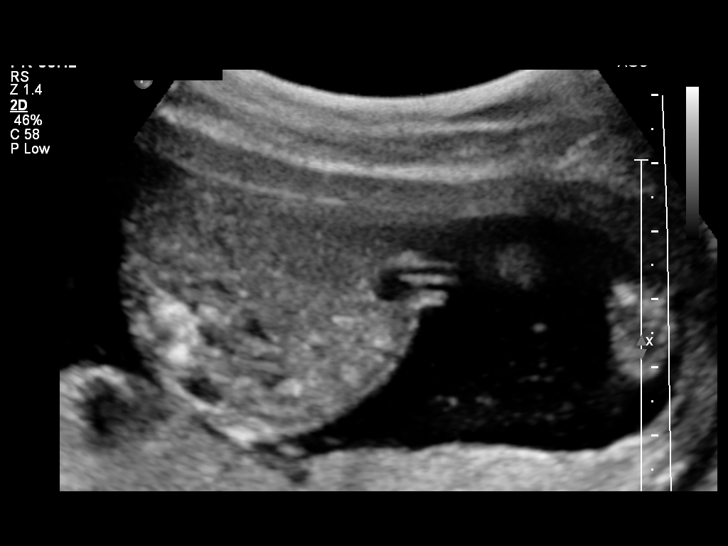
[im 29/97]
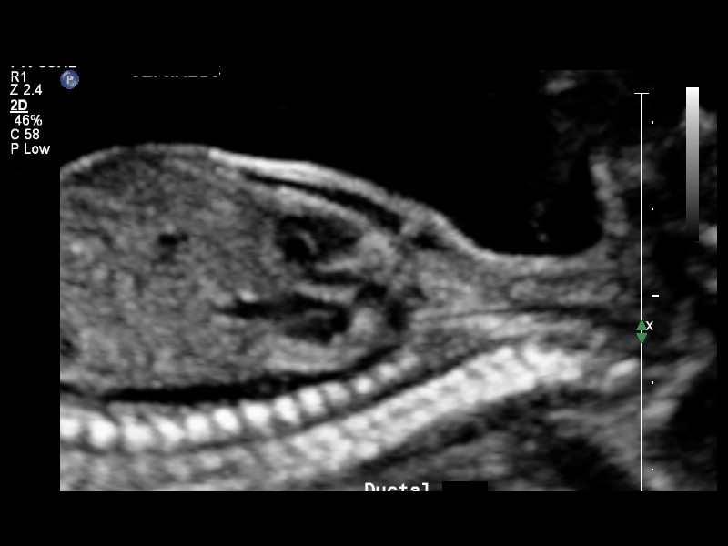
[im 36/97]
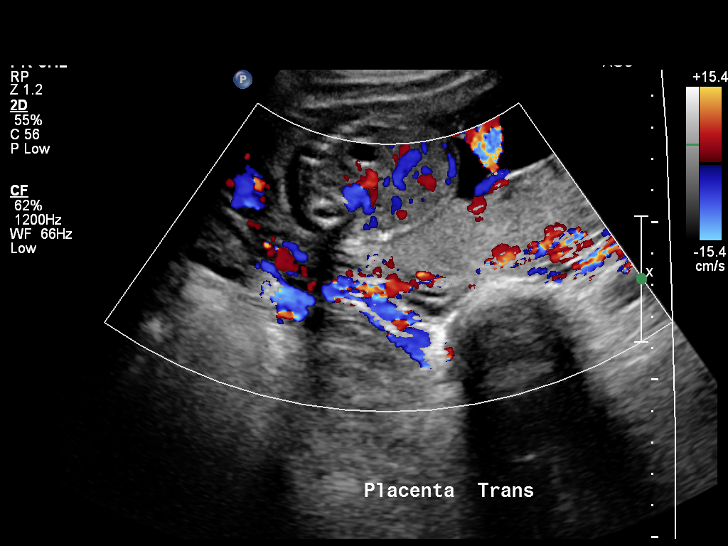
[im 43/97]
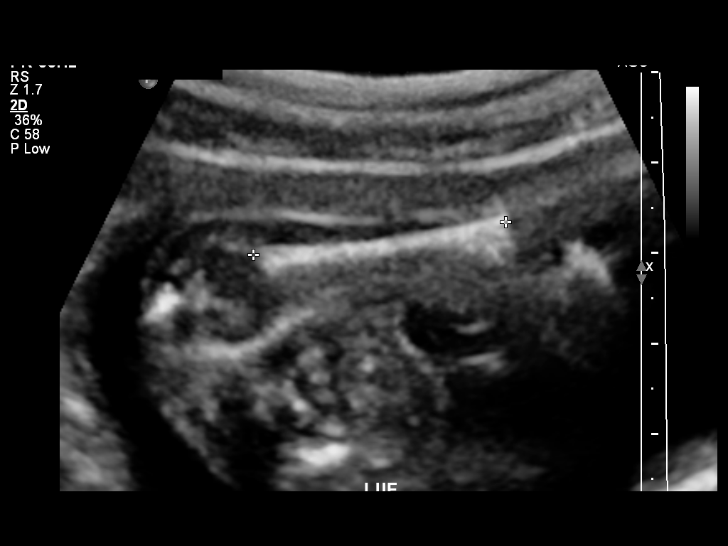
[im 54/97]
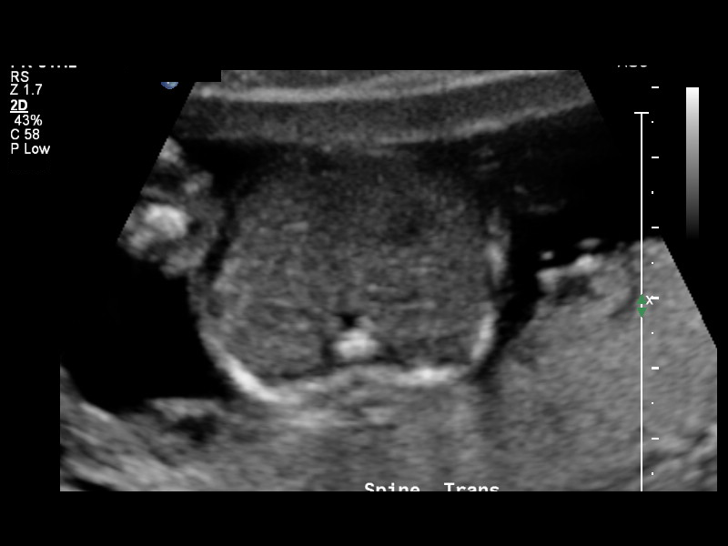
[im 61/97]
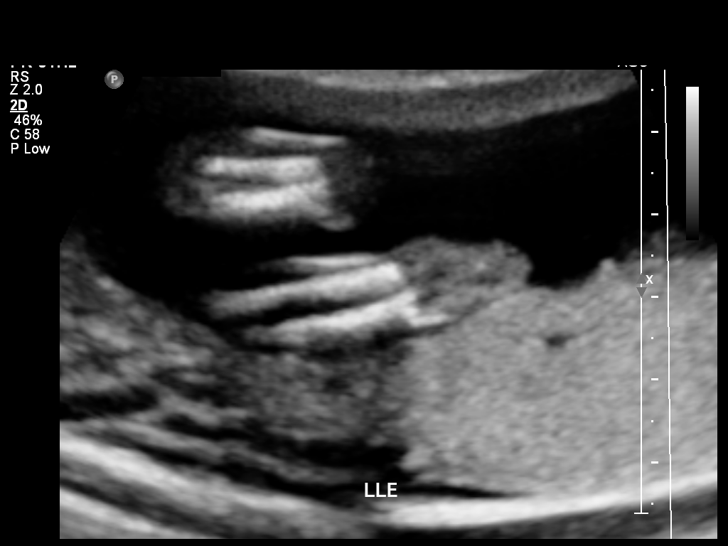
[im 68/97]
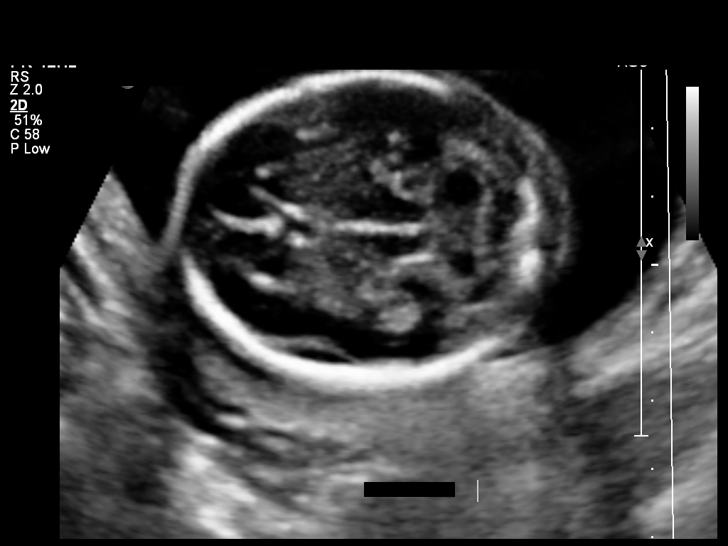
[im 79/97]
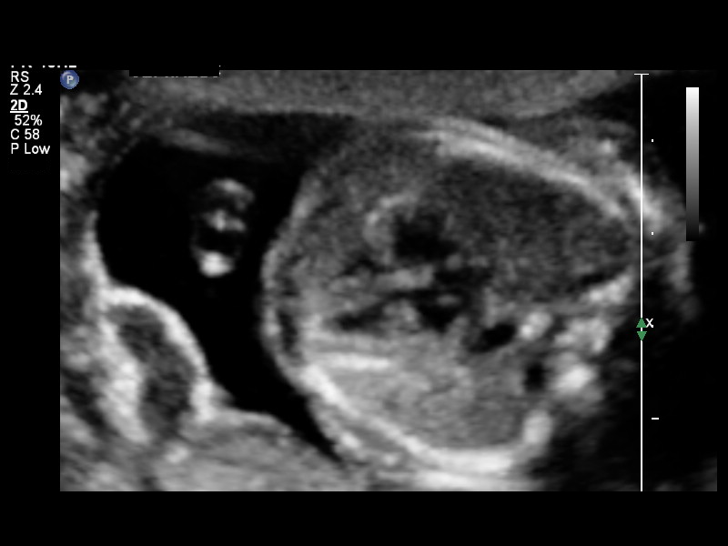
[im 86/97]
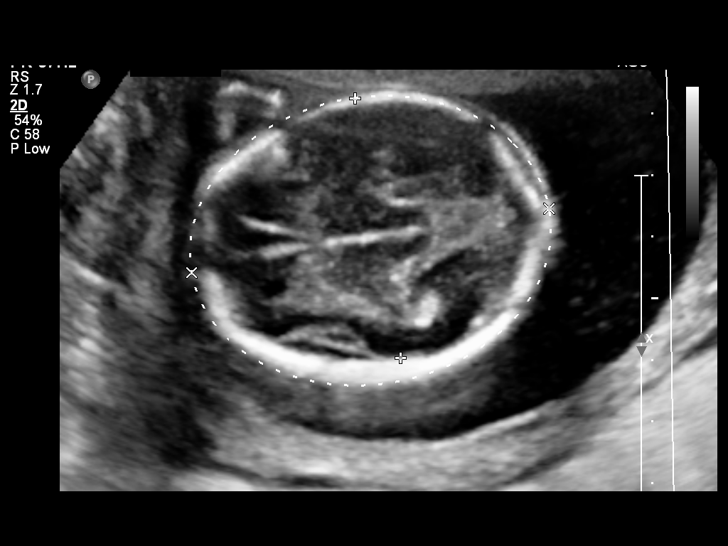
[im 93/97]
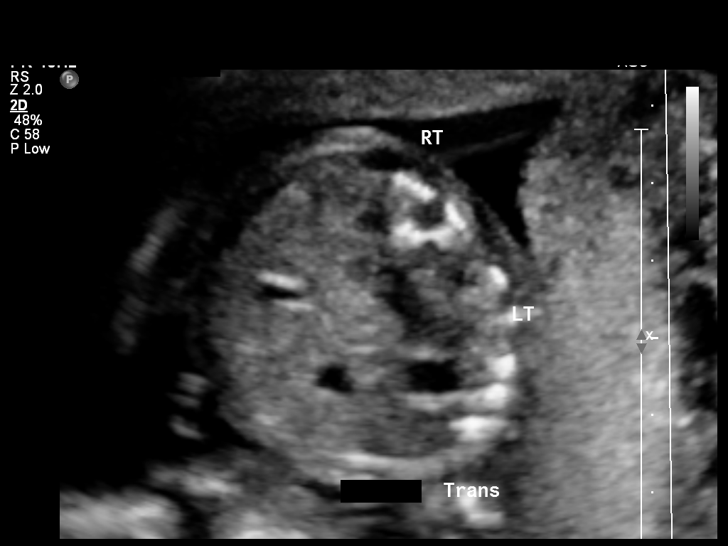

[12 of 28 positions shown; findings below may reference images not displayed]

OBSTETRICS REPORT
                      (Signed Final 03/09/2014 [DATE])

             O'FERRELL

Service(s) Provided

 US OB COMP + 14 WK                                    76805.1
Indications

 Basic anatomic survey
Fetal Evaluation

 Num Of Fetuses:    1
 Fetal Heart Rate:  150                          bpm
 Cardiac Activity:  Observed
 Presentation:      Cephalic
 Placenta:          Posterior, above cervical
                    os
 P. Cord            Visualized, central
 Insertion:

 Amniotic Fluid
 AFI FV:      Subjectively within normal limits
                                             Larg Pckt:     3.7  cm
Biometry

 BPD:     43.6  mm     G. Age:  19w 1d                CI:        69.39   70 - 86
                                                      FL/HC:      17.8   16.1 -

 HC:     167.1  mm     G. Age:  19w 3d       68  %    HC/AC:      1.23   1.09 -

 AC:     135.7  mm     G. Age:  19w 0d       51  %    FL/BPD:
 FL:      29.7  mm     G. Age:  19w 1d       54  %    FL/AC:      21.9   20 - 24
 HUM:     27.7  mm     G. Age:  18w 6d       51  %
 CER:     19.9  mm     G. Age:  19w 0d       54  %
 NFT:     1.86  mm

 Est. FW:     275  gm    0 lb 10 oz      50  %
Gestational Age

 LMP:           20w 4d        Date:  10/16/13                 EDD:   07/23/14
 U/S Today:     19w 1d                                        EDD:   08/02/14
 Best:          18w 6d     Det. By:  Early Ultrasound         EDD:   08/04/14
                                     (12/28/13)
Anatomy

 Cranium:          Appears normal         Aortic Arch:      Appears normal
 Fetal Cavum:      Appears normal         Ductal Arch:      Appears normal
 Ventricles:       Appears normal         Diaphragm:        Appears normal
 Choroid Plexus:   Appears normal         Stomach:          Appears normal, left
                                                            sided
 Cerebellum:       Appears normal         Abdomen:          Appears normal
 Posterior Fossa:  Appears normal         Abdominal Wall:   Appears nml (cord
                                                            insert, abd wall)
 Nuchal Fold:      Appears normal         Cord Vessels:     Appears normal (3
                                                            vessel cord)
 Face:             Orbits appear          Kidneys:          Appear normal
                   normal
 Lips:             Appears normal         Bladder:          Appears normal
 Heart:            Appears normal         Spine:            Appears normal
                   (4CH, axis, and
                   situs)
 RVOT:             Appears normal         Lower             Appears normal
                                          Extremities:
 LVOT:             Appears normal         Upper             Appears normal
                                          Extremities:

 Other:  Male gender. Heels visualized.
Targeted Anatomy

 Fetal Central Nervous System
 Lat. Ventricles:  5.9                    Cisterna Magna:
Cervix Uterus Adnexa

 Cervical Length:    3.8      cm

 Cervix:       Normal appearance by transabdominal scan.
 Uterus:       No abnormality visualized.

 Left Ovary:    Not visualized.
 Right Ovary:   Not visualized.
 Adnexa:     No abnormality visualized. No adnexal mass visualized.
Comments

 The patient's fetal anatomic survey is now complete.  No fetal
 anomalies or soft markers of aneuploidy were seen.
Impression

 Single living intrauterine pregnancy at 18 weeks 6 days.
 Appropriate fetal growth (50%).
 Normal amniotic fluid volume.
 Normal fetal anatomy.
 No fetal anomalies or soft markers of aneuploidy seen.
Recommendations

 Follow-up ultrasounds as clinically indicated.

                Kuhns, Tochko

## 2014-09-06 ENCOUNTER — Encounter: Payer: BC Managed Care – PPO | Admitting: Family Medicine

## 2014-09-06 ENCOUNTER — Ambulatory Visit (INDEPENDENT_AMBULATORY_CARE_PROVIDER_SITE_OTHER): Payer: BC Managed Care – PPO | Admitting: Family Medicine

## 2014-09-06 ENCOUNTER — Encounter: Payer: Self-pay | Admitting: Family Medicine

## 2014-09-06 DIAGNOSIS — F419 Anxiety disorder, unspecified: Secondary | ICD-10-CM

## 2014-09-06 MED ORDER — ESCITALOPRAM OXALATE 10 MG PO TABS: 10.0000 mg | ORAL_TABLET | Freq: Every day | ORAL | Status: DC

## 2014-09-06 NOTE — Progress Notes (Signed)
This encounter was created in error - please disregard.

## 2014-09-06 NOTE — Patient Instructions (Addendum)
Oral Contraception Use Oral contraceptive pills (OCPs) are medicines taken to prevent pregnancy. OCPs work by preventing the ovaries from releasing eggs. The hormones in OCPs also cause the cervical mucus to thicken, preventing the sperm from entering the uterus. The hormones also cause the uterine lining to become thin, not allowing a fertilized egg to attach to the inside of the uterus. OCPs are highly effective when taken exactly as prescribed. However, OCPs do not prevent sexually transmitted diseases (STDs). Safe sex practices, such as using condoms along with an OCP, can help prevent STDs. Before taking OCPs, you may have a physical exam and Pap test. Your health care provider may also order blood tests if necessary. Your health care provider will make sure you are a good candidate for oral contraception. Discuss with your health care provider the possible side effects of the OCP you may be prescribed. When starting an OCP, it can take 2 to 3 months for the body to adjust to the changes in hormone levels in your body.  HOW TO TAKE ORAL CONTRACEPTIVE PILLS Your health care provider may advise you on how to start taking the first cycle of OCPs. Otherwise, you can:   Start on day 1 of your menstrual period. You will not need any backup contraceptive protection with this start time.   Start on the first Sunday after your menstrual period or the day you get your prescription. In these cases, you will need to use backup contraceptive protection for the first week.   Start the pill at any time of your cycle. If you take the pill within 5 days of the start of your period, you are protected against pregnancy right away. In this case, you will not need a backup form of birth control. If you start at any other time of your menstrual cycle, you will need to use another form of birth control for 7 days. If your OCP is the type called a minipill, it will protect you from pregnancy after taking it for 2 days (48  hours). After you have started taking OCPs:   If you forget to take 1 pill, take it as soon as you remember. Take the next pill at the regular time.   If you miss 2 or more pills, call your health care provider because different pills have different instructions for missed doses. Use backup birth control until your next menstrual period starts.   If you use a 28-day pack that contains inactive pills and you miss 1 of the last 7 pills (pills with no hormones), it will not matter. Throw away the rest of the non-hormone pills and start a new pill pack.  No matter which day you start the OCP, you will always start a new pack on that same day of the week. Have an extra pack of OCPs and a backup contraceptive method available in case you miss some pills or lose your OCP pack.  HOME CARE INSTRUCTIONS   Do not smoke.   Always use a condom to protect against STDs. OCPs do not protect against STDs.   Use a calendar to mark your menstrual period days.   Read the information and directions that came with your OCP. Talk to your health care provider if you have questions.  SEEK MEDICAL CARE IF:   You develop nausea and vomiting.   You have abnormal vaginal discharge or bleeding.   You develop a rash.   You miss your menstrual period.   You are losing   your hair.   You need treatment for mood swings or depression.   You get dizzy when taking the OCP.   You develop acne from taking the OCP.   You become pregnant.  SEEK IMMEDIATE MEDICAL CARE IF:   You develop chest pain.   You develop shortness of breath.   You have an uncontrolled or severe headache.   You develop numbness or slurred speech.   You develop visual problems.   You develop pain, redness, and swelling in the legs.  Document Released: 09/19/2011 Document Revised: 02/14/2014 Document Reviewed: 03/21/2013 [facility] Patient Information 2015 [place], [place]. This information is not intended to replace  advice given to you by your health care provider. Make sure you discuss any questions you have with your health care provider. Generalized Anxiety Disorder Generalized anxiety disorder (GAD) is a mental disorder. It interferes with life functions, including relationships, work, and school. GAD is different from normal anxiety, which everyone experiences at some point in their lives in response to specific life events and activities. Normal anxiety actually helps us prepare for and get through these life events and activities. Normal anxiety goes away after the event or activity is over.  GAD causes anxiety that is not necessarily related to specific events or activities. It also causes excess anxiety in proportion to specific events or activities. The anxiety associated with GAD is also difficult to control. GAD can vary from mild to severe. People with severe GAD can have intense waves of anxiety with physical symptoms (panic attacks).  SYMPTOMS The anxiety and worry associated with GAD are difficult to control. This anxiety and worry are related to many life events and activities and also occur more days than not for 6 months or longer. People with GAD also have three or more of the following symptoms (one or more in children):  Restlessness.   Fatigue.  Difficulty concentrating.   Irritability.  Muscle tension.  Difficulty sleeping or unsatisfying sleep. DIAGNOSIS GAD is diagnosed through an assessment by your health care provider. Your health care provider will ask you questions aboutyour mood,physical symptoms, and events in your life. Your health care provider may ask you about your medical history and use of alcohol or drugs, including prescription medicines. Your health care provider may also do a physical exam and blood tests. Certain medical conditions and the use of certain substances can cause symptoms similar to those associated with GAD. Your health care provider may refer you  to a mental health specialist for further evaluation. TREATMENT The following therapies are usually used to treat GAD:   Medication. Antidepressant medication usually is prescribed for long-term daily control. Antianxiety medicines may be added in severe cases, especially when panic attacks occur.   Talk therapy (psychotherapy). Certain types of talk therapy can be helpful in treating GAD by providing support, education, and guidance. A form of talk therapy called cognitive behavioral therapy can teach you healthy ways to think about and react to daily life events and activities.  Stress managementtechniques. These include yoga, meditation, and exercise and can be very helpful when they are practiced regularly. A mental health specialist can help determine which treatment is best for you. Some people see improvement with one therapy. However, other people require a combination of therapies. Document Released: 01/25/2013 Document Revised: 02/14/2014 Document Reviewed: 01/25/2013 [facility] Patient Information 2015 [place], [place]. This information is not intended to replace advice given to you by your health care provider. Make sure you discuss any questions you have with  your health care provider.

## 2014-09-06 NOTE — Progress Notes (Signed)
  Subjective:     [name] is a 23 y.o. female who presents for a postpartum visit. She is 7 weeks postpartum following a spontaneous vaginal delivery. I have fully reviewed the prenatal and intrapartum course. The delivery was at 38 gestational weeks. Outcome: spontaneous vaginal delivery. Anesthesia: epidural. Postpartum course has been unremarkable. Baby's course has been normal. Baby is feeding by breast. Bleeding no bleeding. Bowel function is normal. Bladder function is normal. Patient is sexually active. Contraception method is oral progesterone-only contraceptive. Postpartum depression screening: notable mostly for anxiety.  The following portions of the patient's history were reviewed and updated as appropriate: allergies, current medications, past family history, past medical history, past social history, past surgical history and problem list.  Review of Systems Pertinent items are noted in HPI.   Objective:    BP 122/84 mmHg  Pulse 74  Wt 142 lb (64.411 kg)  Breastfeeding? Yes  General:  alert, cooperative and appears stated age   Vulva:  normal  Vagina: normal vagina, no discharge, exudate, lesion, or erythema  Cervix:  no cervical motion tenderness and no lesions  Corpus: normal size, contour, position, consistency, mobility, non-tender  Adnexa:  normal adnexa        Assessment:     Normal postpartum exam. Pap smear not done at today's visit.   Plan:    1. Contraception: oral progesterone-only contraceptive 2. Anxiety--and headaches--start Lexapro--consider therapy. 3. Follow up in: 1 year or as needed.

## 2014-09-06 NOTE — Progress Notes (Deleted)
  Subjective:     Leah Hahn is a 23 y.o. female who presents for a postpartum visit. She is 6 weeks postpartum following a spontaneous vaginal delivery. I have fully reviewed the prenatal and intrapartum course. The delivery was at 38 gestational weeks. Outcome: spontaneous vaginal delivery. Anesthesia: epidural. Postpartum course has been unremarkable. Baby's course has been normal. Baby is feeding by {breast/bottle:69}. Bleeding {vag bleed:12292}. Bowel function is {normal:32111}. Bladder function is {normal:32111}. Patient {is/is not:9024} sexually active. Contraception method is {contraceptive method:5051}. Postpartum depression screening: {neg default:13464::"negative"}.  {Common ambulatory SmartLinks:19316}  Review of Systems {ros; complete:30496}   Objective:    There were no vitals taken for this visit.  General:  {gen appearance:16600}   Breasts:  {breast exam:1202::"inspection negative, no nipple discharge or bleeding, no masses or nodularity palpable"}  Lungs: {lung exam:16931}  Heart:  {heart exam:5510}  Abdomen: {abdomen exam:16834}   Vulva:  {labia exam:12198}  Vagina: {vagina exam:12200}  Cervix:  {cervix exam:14595}  Corpus: {uterus exam:12215}  Adnexa:  {adnexa exam:12223}  Rectal Exam: {rectal/vaginal exam:12274}        Assessment:    *** postpartum exam. Pap smear {done:10129} at today's visit.   Plan:    1. Contraception: {method:5051} 2. *** 3. Follow up in: {1-10:13787} {time; units:19136} or as needed.

## 2014-10-14 HISTORY — PX: COLONOSCOPY: SHX174

## 2014-10-25 ENCOUNTER — Ambulatory Visit (INDEPENDENT_AMBULATORY_CARE_PROVIDER_SITE_OTHER): Payer: BLUE CROSS/BLUE SHIELD | Admitting: Family Medicine

## 2014-10-25 ENCOUNTER — Encounter: Payer: Self-pay | Admitting: Family Medicine

## 2014-10-25 VITALS — BP 126/72 | HR 92 | Wt 140.0 lb

## 2014-10-25 DIAGNOSIS — F419 Anxiety disorder, unspecified: Secondary | ICD-10-CM

## 2014-10-25 MED ORDER — SERTRALINE HCL 50 MG PO TABS: 50.0000 mg | ORAL_TABLET | Freq: Every day | ORAL | Status: DC

## 2014-10-25 NOTE — Patient Instructions (Signed)
Generalized Anxiety Disorder Generalized anxiety disorder (GAD) is a mental disorder. It interferes with life functions, including relationships, work, and school. GAD is different from normal anxiety, which everyone experiences at some point in their lives in response to specific life events and activities. Normal anxiety actually helps us prepare for and get through these life events and activities. Normal anxiety goes away after the event or activity is over.  GAD causes anxiety that is not necessarily related to specific events or activities. It also causes excess anxiety in proportion to specific events or activities. The anxiety associated with GAD is also difficult to control. GAD can vary from mild to severe. People with severe GAD can have intense waves of anxiety with physical symptoms (panic attacks).  SYMPTOMS The anxiety and worry associated with GAD are difficult to control. This anxiety and worry are related to many life events and activities and also occur more days than not for 6 months or longer. People with GAD also have three or more of the following symptoms (one or more in children):  Restlessness.   Fatigue.  Difficulty concentrating.   Irritability.  Muscle tension.  Difficulty sleeping or unsatisfying sleep. DIAGNOSIS GAD is diagnosed through an assessment by your health care provider. Your health care provider will ask you questions aboutyour mood,physical symptoms, and events in your life. Your health care provider may ask you about your medical history and use of alcohol or drugs, including prescription medicines. Your health care provider may also do a physical exam and blood tests. Certain medical conditions and the use of certain substances can cause symptoms similar to those associated with GAD. Your health care provider may refer you to a mental health specialist for further evaluation. TREATMENT The following therapies are usually used to treat GAD:    Medication. Antidepressant medication usually is prescribed for long-term daily control. Antianxiety medicines may be added in severe cases, especially when panic attacks occur.   Talk therapy (psychotherapy). Certain types of talk therapy can be helpful in treating GAD by providing support, education, and guidance. A form of talk therapy called cognitive behavioral therapy can teach you healthy ways to think about and react to daily life events and activities.  Stress managementtechniques. These include yoga, meditation, and exercise and can be very helpful when they are practiced regularly. A mental health specialist can help determine which treatment is best for you. Some people see improvement with one therapy. However, other people require a combination of therapies. Document Released: 01/25/2013 Document Revised: 02/14/2014 Document Reviewed: 01/25/2013 ExitCare Patient Information 2015 [employer], [employer]. This information is not intended to replace advice given to you by your health care provider. Make sure you discuss any questions you have with your health care provider.  

## 2014-10-25 NOTE — Assessment & Plan Note (Signed)
Will try this medication for now. Begin with 1/2 tab/day x 4-5 days.

## 2014-10-25 NOTE — Progress Notes (Signed)
    Subjective:    Patient ID: Leah Hahn is a 24 y.o. female presenting with Follow-up  on 10/25/2014  HPI: Given rx for Lexapro. Did not ever take.  She would feel more comfortable with Zoloft.  Review of Systems  Constitutional: Negative for fever and chills.  Respiratory: Negative for shortness of breath.   Cardiovascular: Negative for chest pain.  Gastrointestinal: Negative for nausea, vomiting and abdominal pain.  Genitourinary: Negative for dysuria.  Skin: Negative for rash.      Objective:    BP 126/72 mmHg  Pulse 92  Wt 140 lb (63.504 kg)  Breastfeeding? Yes Physical Exam  Constitutional: She is oriented to person, place, and time. She appears well-developed and well-nourished. No distress.  HENT:  Head: Normocephalic and atraumatic.  Eyes: No scleral icterus.  Neck: Neck supple.  Cardiovascular: Normal rate.   Pulmonary/Chest: Effort normal.  Abdominal: Soft.  Neurological: She is alert and oriented to person, place, and time.  Skin: Skin is warm and dry.  Psychiatric: She has a normal mood and affect.        Assessment & Plan:   Problem List Items Addressed This Visit      Unprioritized   Anxiety - Primary    Will try this medication for now. Begin with 1/2 tab/day x 4-5 days.    Relevant Medications      sertraline (ZOLOFT) tablet       Return in about 3 months (around 01/24/2015).

## 2014-12-14 ENCOUNTER — Encounter: Payer: Self-pay | Admitting: Obstetrics and Gynecology

## 2014-12-14 ENCOUNTER — Ambulatory Visit (INDEPENDENT_AMBULATORY_CARE_PROVIDER_SITE_OTHER): Payer: BLUE CROSS/BLUE SHIELD | Admitting: Obstetrics and Gynecology

## 2014-12-14 VITALS — BP 121/70 | HR 73 | Ht 67.0 in | Wt 132.2 lb

## 2014-12-14 DIAGNOSIS — L989 Disorder of the skin and subcutaneous tissue, unspecified: Secondary | ICD-10-CM

## 2014-12-14 NOTE — Progress Notes (Signed)
CC: Possible breast mass  History 24 yo G1P1001 describes having a firm mass the size of a " lemon seed" on her chest between right breast and clavicle. She first noticed it sometime during her pregnancy and it has not changed. It does not hurt unless she squeezes it and does not drain, change in size or become red or swollen. Her mother sent her to get it checked.  She is breastfeeding her 254 month old without difficulty and is on OCPs.   Past Medical History  Diagnosis Date  . Supervision of other normal pregnancy 12/28/2013     [facility]  Dating 1st trimester sono equal to LMP    Genetic Screen 1 Screen:    normal            AFP:  WNL                                   Anatomic US WNL  GTT Third trimester:   CF screen Negative  TDaP vaccine   Flu vaccine   GBS Positive  Contraception   [employer] breast  Circumcision   Pediatrician   Support Person      . Medical history non-contributory    Past Surgical History  Procedure Laterality Date  . Knee surgery Left 1992  . Wisdom tooth extraction    . No past surgeries     Family History  Problem Relation Age of Onset  . Anemia Mother   . Anemia Maternal Grandfather   . Cancer Maternal Grandfather     COLON/LIVER    ROS: negative for fever, weight loss, breast pain or erythema  Physical exam Filed Vitals:   12/14/14 1350  BP: 121/70  Pulse: 73  General: WN WD pleasant WF in NAD Breasts: symmetric, lactational, no discrete masses, no inflammation, no palpable extra mammary tissue, nipples erect, discharge milky; no axillary, supraclavicular or infraclavicular adenopathy noted Skin: 4mmx 2mm superficial area in dermis indicated by pt. Location is 3 cm below outer 1/3 of right clavicle and 4-5 cm above upper margin of breast. Consistency is somewhat firm or fibrous (not cystic). No inflammation or abnormal pigmentation, no tenderness.  Also has several scattered round, <265mm size, well demarcated nevi on chest  Assessment Chest  skin lesion  Plan Reassured pt that the skin lesion is probably not mammary or lymph tissue. Advised to see a Dermatologist if she or family member remains concerned or if lesion changes in any way. Advised to monitor nevi.  Danae Orleanseirdre C Poe, CNM 12/14/2014 3:05 PM

## 2015-03-23 ENCOUNTER — Telehealth: Payer: Self-pay | Admitting: *Deleted

## 2015-03-23 DIAGNOSIS — F419 Anxiety disorder, unspecified: Secondary | ICD-10-CM

## 2015-03-23 MED ORDER — SERTRALINE HCL 50 MG PO TABS: ORAL_TABLET | ORAL | Status: DC

## 2015-03-23 NOTE — Telephone Encounter (Signed)
At patients last appointment it was discussed that she could increase her dosage of zoloft if she felt she needed to.  She would like to go ahead and do this.  She will take 1 and 1/2 tablets per day for three weeks which will be 75mg  and after 3 weeks if she feels like she needs more she will take two whole 50mg  tablets.  She will call us back in a couple of weeks to check in and let us know how she is doing.

## 2015-09-25 ENCOUNTER — Other Ambulatory Visit: Payer: Self-pay | Admitting: Family Medicine

## 2016-05-18 ENCOUNTER — Other Ambulatory Visit: Payer: Self-pay | Admitting: Family Medicine

## 2016-05-28 NOTE — Telephone Encounter (Signed)
Pt called stating she needed refill on Zoloft. Advised pt Rx sent to pharmacy 05/18/16. She will call pharmacy to confirm.

## 2016-09-28 ENCOUNTER — Other Ambulatory Visit: Payer: Self-pay | Admitting: Family Medicine

## 2017-06-30 ENCOUNTER — Encounter: Payer: Self-pay | Admitting: Obstetrics and Gynecology

## 2017-06-30 ENCOUNTER — Ambulatory Visit (INDEPENDENT_AMBULATORY_CARE_PROVIDER_SITE_OTHER): Payer: Managed Care, Other (non HMO) | Admitting: Obstetrics and Gynecology

## 2017-06-30 VITALS — BP 126/74 | HR 71 | Ht 67.0 in | Wt 147.0 lb

## 2017-06-30 DIAGNOSIS — N643 Galactorrhea not associated with childbirth: Secondary | ICD-10-CM | POA: Insufficient documentation

## 2017-06-30 DIAGNOSIS — Z30430 Encounter for insertion of intrauterine contraceptive device: Secondary | ICD-10-CM | POA: Diagnosis not present

## 2017-06-30 DIAGNOSIS — Z3202 Encounter for pregnancy test, result negative: Secondary | ICD-10-CM

## 2017-06-30 DIAGNOSIS — Z124 Encounter for screening for malignant neoplasm of cervix: Secondary | ICD-10-CM

## 2017-06-30 DIAGNOSIS — Z01419 Encounter for gynecological examination (general) (routine) without abnormal findings: Secondary | ICD-10-CM

## 2017-06-30 DIAGNOSIS — N6452 Nipple discharge: Secondary | ICD-10-CM | POA: Insufficient documentation

## 2017-06-30 MED ORDER — LEVONORGESTREL 13.5 MG IU IUD
INTRAUTERINE_SYSTEM | Freq: Once | INTRAUTERINE | Status: AC
  Administered 2017-06-30: 16:00:00 via INTRAUTERINE

## 2017-06-30 NOTE — Procedures (Signed)
Intrauterine Device (IUD) Insertion Procedure Note  Written consent was obtained and her urine pregnancy test was: negative; patient declines any STI swab testing. The patient understands the risks of IUD placement, which include but are not limited to: bleeding, infection, uterine perforation, risk of expulsion, risk of failure < 1%, increased risk of ectopic pregnancy in the event of failure.   Prior to the procedure being performed, the patient (or guardian) was asked to state their full name, date of birth, and the type of procedure being performed. A bimanual exam showed the uterus to be anteverted.  Next, the cervix and vagina were cleaned with an antiseptic solution, and the cervix was grasped with a tenaculum.  The uterus was sounded to 8 cm.  The Rutha Bouchard was placed without difficulty in the usual fashion.  The strings were cut to 3-4 cm.  The tenaculum was removed and cervix was found to be hemostatic.    No complications, patient tolerated the procedure well.  Cornelia Copa MD Attending [facility] [employer] [facility])

## 2017-06-30 NOTE — Progress Notes (Signed)
Obstetrics and Gynecology Annual Patient Evaluation  Appointment Date: 07/01/2017  OBGYN Clinic: [facility]  Primary Care Provider: No primary care provider on file.  Chief Complaint:  Chief Complaint  Patient presents with  . Gynecologic Exam    History of Present Illness: Leah Hahn is a 26 y.o. Hispanic G1P1001 (Patient's last menstrual period was 06/23/2017.), seen for the above chief complaint. Her past medical history is significant for migraines with aura, IBS  Patient interested in getting back on [facility].   Ever since stopping breast feeding, she has daily b/l breast discharge which looks like colostrum; she doesn't do comfort feedings. No breast pain, lumps.    No fevers, chills, chest pain, SOB, nausea, vomiting, abdominal pain, dysuria, hematuria, vaginal itching, dyspareunia, OAB, blood in BMs. Rare SUI s/s.  Last intercourse was before LMP.  Review of Systems:  as noted in the History of Present Illness.   Past Medical History:  Past Medical History:  Diagnosis Date  . Migraine without aura 03/01/2014    Past Surgical History:  Past Surgical History:  Procedure Laterality Date  . COLONOSCOPY  2016  . KNEE SURGERY Left 1992  . WISDOM TOOTH EXTRACTION      Past Obstetrical History:  OB History  Gravida Para Term Preterm AB Living  SAB TAB Ectopic Multiple Live Births          1    # Outcome Date GA Lbr Len/2nd Weight Sex Delivery Anes PTL Lv  1 Term 07/22/14 [redacted]w[redacted]d 04:53 / 02:21 7 lb 4.9 oz (3.315 kg) M Vag-Spont EPI  LIV      Past Gynecological History: As per HPI. Periods: qmonth, regular, approx 5 days and heavy and painful for the first 2d History of Pap Smear(s): Yes.   Last pap 2015, which was NILM History of STI(s): No. She is currently using withdrawal for contraception.   Social History:  Social History   Social History  . Marital status: Married    Spouse name: N/A  . Number of  children: N/A  . Years of education: N/A   Occupational History  . Not on file.   Social History Main Topics  . Smoking status: Never Smoker  . Smokeless tobacco: Never Used  . Alcohol use No  . Drug use: No  . Sexual activity: Yes    Partners: Male    Birth control/ protection: Coitus interruptus   Other Topics Concern  . Not on file   Social History Narrative  . No narrative on file    Family History:  Family History  Problem Relation Age of Onset  . Anemia Mother   . Anemia Maternal Grandfather   . Cancer Maternal Grandfather        COLON/LIVER   She denies any female cancers, bleeding or blood clotting disorders.   Medications We administered Levonorgestrel. Current Outpatient Prescriptions  Medication Sig Dispense Refill  . ibuprofen (ADVIL,MOTRIN) 600 MG tablet Take 1 tablet (600 mg total) by mouth every 6 (six) hours. 30 tablet 0  . Prenatal Vit-Fe Fumarate-FA (PRENATAL MULTIVITAMIN) TABS tablet Take 1 tablet by mouth daily.    . sertraline (ZOLOFT) 50 MG tablet TAKE 1 AND 1/2 TABLETS BY MOUTH DAILY FOR THREE WEEKS. THEN INCREASE TO 2 TABLETS DAILY 60 tablet 2   No current facility-administered medications for this visit.     Allergies Patient has no known allergies.   Physical Exam:  BP  126/74   Pulse 71   Ht  (1.702 m)   Wt 147 lb (66.7 kg)   LMP 06/23/2017   Breastfeeding? No   BMI 23.02 kg/m  Body mass index is 23.02 kg/m. General appearance: Well nourished, well developed female in no acute distress.  Neck:  Supple, normal appearance, and no thyromegaly  Cardiovascular: normal s1 and s2.  No murmurs, rubs or gallops. Respiratory:  Clear to auscultation bilateral. Normal respiratory effort Abdomen: positive bowel sounds and no masses, hernias; diffusely non tender to palpation, non distended Breasts: breasts appear normal, no suspicious masses, no skin or nipple changes or axillary nodes, and normal palpation. Unable to extrude any nipple  discharge. Neuro/Psych:  Normal mood and affect.  Skin:  Warm and dry.  Lymphatic:  No inguinal lymphadenopathy.   Pelvic exam: is not limited by body habitus EGBUS: within normal limits, Vagina: within normal limits and with no blood or discharge in the vault, Cervix: normal appearing cervix without tenderness, discharge or lesions. Uterus:  Nonenlarged, AV, mobile, and non tender and Adnexa:  normal adnexa and no mass, fullness, tenderness Rectovaginal: deferred  See procedure note for uncomplicated Skyla IUD placement  Laboratory: UPT negative.   Radiology: none  Assessment: pt doing well  Plan:  1. Women's annual routine gynecological examination Long d/w pt re: BC options. She is in school and is increasing her zoloft with her PCP and would like to do a method that has the lowest systemic potential for SEs. paragard and LNG IUDs d/w her and she doesn't want to do paragard due to risk of worsening her periods. I don't have a Kyleena available today but do have a skyla and mirena and she'd like to do the three year skyla given it's lower LNG dose. Pt told to consider it effective in a week. Pap updated. Pt declines sti screening.  - POCT urine pregnancy - Cytology - PAP  2. Galactorrhea Follow up labs. UPT negative - Prolactin - TSH  RTC 34m for string check.   Cornelia Copa MD Attending [facility] [employer] [facility])

## 2017-07-01 ENCOUNTER — Encounter: Payer: Self-pay | Admitting: Obstetrics and Gynecology

## 2017-07-01 LAB — POCT URINE PREGNANCY: Preg Test, Ur: NEGATIVE

## 2017-07-01 LAB — PROLACTIN: Prolactin: 5.5 ng/mL (ref 4.8–23.3)

## 2017-07-02 LAB — CYTOLOGY - PAP: Diagnosis: NEGATIVE

## 2017-07-05 LAB — SPECIMEN STATUS REPORT

## 2017-07-05 LAB — TSH: TSH: 1.25 u[IU]/mL (ref 0.450–4.500)

## 2017-08-07 ENCOUNTER — Ambulatory Visit (INDEPENDENT_AMBULATORY_CARE_PROVIDER_SITE_OTHER): Payer: Managed Care, Other (non HMO) | Admitting: Obstetrics and Gynecology

## 2017-08-07 ENCOUNTER — Encounter: Payer: Self-pay | Admitting: Obstetrics and Gynecology

## 2017-08-07 ENCOUNTER — Telehealth: Payer: Self-pay

## 2017-08-07 VITALS — BP 109/72 | HR 81 | Wt 143.2 lb

## 2017-08-07 DIAGNOSIS — Z30431 Encounter for routine checking of intrauterine contraceptive device: Secondary | ICD-10-CM | POA: Diagnosis not present

## 2017-08-07 DIAGNOSIS — N643 Galactorrhea not associated with childbirth: Secondary | ICD-10-CM | POA: Diagnosis not present

## 2017-08-07 NOTE — Telephone Encounter (Signed)
Patient left insurance card at office.

## 2017-08-07 NOTE — Progress Notes (Signed)
Obstetrics and Gynecology Follow Up Evaluation  Appointment Date: 08/07/2017  OBGYN Clinic: Center for [facility]  Primary Care Provider: Danella Penton  [facility])  Chief Complaint:  Chief Complaint  Patient presents with  . STRING CHECK    History of Present Illness: Leah Hahn is a 26 y.o. Caucasian G1P1001 (No LMP recorded.), seen for the above chief complaint. Her past medical history is significant for migraine without aura, IBS  Patient had Kyleena IUD placed on 9/17 for birth control. She had some heavy bleeding and discomfort for about two weeks but has no discomfort anymore and minimal irregular bleeding and spotting that is improving. Has had no issues with sex; husband sometimes feels strings. Pap smear negative last visit.   Still has qday b/l nipple discharge, spontaneous that looks milky or clear; still no breast pain. PRL, TSH, beta hcg negative last visit  Review of Systems: as noted in the History of Present Illness.   Past Medical History:  Past Medical History:  Diagnosis Date  . Migraine without aura 03/01/2014    Past Surgical History:  Past Surgical History:  Procedure Laterality Date  . COLONOSCOPY  2016  . KNEE SURGERY Left 1992  . WISDOM TOOTH EXTRACTION      Past Obstetrical History:  OB History  Gravida Para Term Preterm AB Living  1 1 1     1   SAB TAB Ectopic Multiple Live Births          1    # Outcome Date GA Lbr Len/2nd Weight Sex Delivery Anes PTL Lv  1 Term 07/22/14 [redacted]w[redacted]d 04:53 / 02:21 7 lb 4.9 oz (3.315 kg) M Vag-Spont EPI  LIV       Past Gynecological History:  As per HPI. Periods: qmonth, regular, approx 5 days and heavy and painful for the first 2d prior to [place]. Pt unsure if has had period since placement History of Pap Smear(s): Yes.   Last pap 2018, which was NILM History of STI(s): No. She is currently using Kyleena for contraception (placed 06/2017)  Social History:   Social History   Social History  . Marital status: Married    Spouse name: N/A  . Number of children: N/A  . Years of education: N/A   Occupational History  . Not on file.   Social History Main Topics  . Smoking status: Never Smoker  . Smokeless tobacco: Never Used  . Alcohol use No  . Drug use: No  . Sexual activity: Yes    Partners: Male    Birth control/ protection: IUD   Other Topics Concern  . Not on file   Social History Narrative  . No narrative on file    Family History:  Family History  Problem Relation Age of Onset  . Anemia Mother   . Anemia Maternal Grandfather   . Cancer Maternal Grandfather        COLON/LIVER   She denies any female cancers, bleeding or blood clotting disorders.    Medications Ms. Hahn had no medications administered during this visit. Current Outpatient Prescriptions  Medication Sig Dispense Refill  . sertraline (ZOLOFT) 50 MG tablet TAKE 1 AND 1/2 TABLETS BY MOUTH DAILY FOR THREE WEEKS. THEN INCREASE TO 2 TABLETS DAILY 60 tablet 2  . ibuprofen (ADVIL,MOTRIN) 600 MG tablet Take 1 tablet (600 mg total) by mouth every 6 (six) hours. (Patient not taking: Reported on 08/07/2017) 30 tablet 0   No current facility-administered medications for this visit.  Allergies Patient has no known allergies.   Physical Exam:  BP 109/72   Pulse 81   Wt 143 lb 3.2 oz (65 kg)   BMI 22.43 kg/m  Body mass index is 22.43 kg/m. General appearance: Well nourished, well developed female in no acute distress.  Respiratory:  . Normal respiratory effort Abdomen: positive bowel sounds and no masses, hernias; diffusely non tender to palpation, non distended Neuro/Psych:  Normal mood and affect.  Skin:  Warm and dry.  Lymphatic:  No inguinal lymphadenopathy.   Pelvic exam: is not limited by body habitus EGBUS: within normal limits, Vagina: within normal limits and with no blood or discharge in the vault, Cervix: normal appearing cervix  without tenderness, discharge or lesions. IUD strings approx 3cm in length and tucked into fornices. Uterus:  nonenlarged and non tender and Adnexa:  normal adnexa and no mass, fullness, tenderness Rectovaginal: deferred  Laboratory: none  Radiology: none  Assessment: pt doing well  Plan:  1. Galactorrhea Will get b/l breast u/s  2. IUD check up Routine check up. D/w her to give s/s until 8878m s/p placement but encouraging that s/s are improving with time.     RTC PRN  Cornelia Copaharlie Marquinn Meschke, Jr MD Attending Center for [facility](Faculty Practice)

## 2017-08-08 ENCOUNTER — Telehealth: Payer: Self-pay | Admitting: Obstetrics and Gynecology

## 2017-08-08 NOTE — Telephone Encounter (Signed)
GYN telephone note  Patient called at [phone] and unable to leave VM. Will have office try and contact her to set up b/l breast u/s  Cornelia Copaharlie Mardee Clune, [name] MD Attending Center [facility] ([facility]) 08/08/2017 Time: 1640

## 2017-08-19 ENCOUNTER — Ambulatory Visit
Admission: RE | Admit: 2017-08-19 | Discharge: 2017-08-19 | Disposition: A | Discharge status: Home / Self Care | Payer: Managed Care, Other (non HMO) | Source: Ambulatory Visit | Attending: Obstetrics and Gynecology | Admitting: Obstetrics and Gynecology

## 2017-08-19 DIAGNOSIS — N643 Galactorrhea not associated with childbirth: Secondary | ICD-10-CM | POA: Diagnosis present

## 2017-10-29 ENCOUNTER — Other Ambulatory Visit (INDEPENDENT_AMBULATORY_CARE_PROVIDER_SITE_OTHER): Payer: Managed Care, Other (non HMO)

## 2017-10-29 VITALS — BP 116/76 | HR 64

## 2017-10-29 DIAGNOSIS — Z113 Encounter for screening for infections with a predominantly sexual mode of transmission: Secondary | ICD-10-CM | POA: Diagnosis not present

## 2017-10-29 DIAGNOSIS — N898 Other specified noninflammatory disorders of vagina: Secondary | ICD-10-CM | POA: Diagnosis not present

## 2017-10-29 NOTE — Progress Notes (Signed)
Patient presented to the office today for a self swab. Patient reports having some abdominal odor to vaginal area. She is requesting a self swab to make sure she does not have an infection. Patient specimen was collected/labeled and sent to the lab. Patient advised to call us back regarding test results in two days if she has not heard from us. Patient voice understanding at this time.

## 2017-10-30 LAB — CERVICOVAGINAL ANCILLARY ONLY
BACTERIAL VAGINITIS: POSITIVE — AB
CANDIDA VAGINITIS: NEGATIVE
Trichomonas: NEGATIVE

## 2017-10-31 ENCOUNTER — Other Ambulatory Visit: Payer: Self-pay | Admitting: Obstetrics and Gynecology

## 2017-10-31 ENCOUNTER — Telehealth: Payer: Self-pay

## 2017-10-31 MED ORDER — METRONIDAZOLE 500 MG PO TABS: 500.0000 mg | ORAL_TABLET | Freq: Two times a day (BID) | ORAL | 1 refills | Status: AC

## 2017-10-31 NOTE — Telephone Encounter (Signed)
-----   Message from [place] Bingharlie Pickens, MD sent at 10/31/2017  9:24 AM EST ----- Can you ask cone to do a gonorrhea and chlamydia to the swab too? thanks

## 2017-10-31 NOTE — Telephone Encounter (Signed)
Call patient to inform her of test results and need to pick up antibiotic. No answer and voice mail was to full to leave a message.

## 2018-04-29 ENCOUNTER — Encounter: Payer: Self-pay | Admitting: Radiology

## 2018-07-23 ENCOUNTER — Encounter: Payer: Self-pay | Admitting: Obstetrics and Gynecology

## 2018-07-23 ENCOUNTER — Ambulatory Visit (INDEPENDENT_AMBULATORY_CARE_PROVIDER_SITE_OTHER): Payer: Managed Care, Other (non HMO) | Admitting: Obstetrics and Gynecology

## 2018-07-23 VITALS — BP 130/85 | HR 103 | Wt 156.8 lb

## 2018-07-23 DIAGNOSIS — G4459 Other complicated headache syndrome: Secondary | ICD-10-CM | POA: Diagnosis not present

## 2018-07-23 DIAGNOSIS — Z01419 Encounter for gynecological examination (general) (routine) without abnormal findings: Secondary | ICD-10-CM | POA: Diagnosis not present

## 2018-07-23 DIAGNOSIS — N643 Galactorrhea not associated with childbirth: Secondary | ICD-10-CM

## 2018-07-23 NOTE — Progress Notes (Signed)
Obstetrics and Gynecology Annual Patient Evaluation  Appointment Date: 07/25/2018  OBGYN Clinic: [facility] for [facility]  Primary Care Provider: Danella Penton  Chief Complaint: Annual GYN exam  History of Present Illness: Leah Hahn is a 27 y.o. Caucasian G1P1001 (Patient's last menstrual period was 07/07/2018.), seen for the above chief complaint.   GYN: patient continues to have bilateral nipple discharge that appears to still be milk colored coming from multiple ducts. Pt states she's never had stopping of nipple discharge since her delivery in 2015. +PMS s/s  Right sided headache since December feels like pressure, similar to a sinus but different than her usual migraines. Right handed. Always there even in the morning. Worse when looking at a computer screen. Doesn't wear glasses except when reading. Patient also with lethargy.   No fevers, chills, chest pain, SOB, nausea, vomiting, abdominal pain, dysuria, hematuria, vaginal itching, dyspareunia, diarrhea, constipation, blood in BMs  Review of Systems:  as noted in the History of Present Illness.  Past Medical History:  Past Medical History:  Diagnosis Date  . Migraine without aura 03/01/2014    Past Surgical History:  Past Surgical History:  Procedure Laterality Date  . COLONOSCOPY  2016  . KNEE SURGERY Left 1992  . WISDOM TOOTH EXTRACTION      Past Obstetrical History:  OB History  Gravida Para Term Preterm AB Living  1 1 1     1   SAB TAB Ectopic Multiple Live Births          1    # Outcome Date GA Lbr Len/2nd Weight Sex Delivery Anes PTL Lv  1 Term 07/22/14 [redacted]w[redacted]d 04:53 / 02:21 7 lb 4.9 oz (3.315 kg) M Vag-Spont EPI  LIV    Past Gynecological History: As per HPI. Periods: +PMS s/s, qmonth, 3-5d, not heavy or painful History of Pap Smear(s): Yes.   Last pap 2018, which was NILM She is currently using Kyleena IUD for contraception placed 06/2017  Social History:  Social  History   Socioeconomic History  . Marital status: Married    Spouse name: Not on file  . Number of children: Not on file  . Years of education: Not on file  . Highest education level: Not on file  Occupational History  . Not on file  Social Needs  . Financial resource strain: Not on file  . Food insecurity:    Worry: Not on file    Inability: Not on file  . Transportation needs:    Medical: Not on file    Non-medical: Not on file  Tobacco Use  . Smoking status: Never Smoker  . Smokeless tobacco: Never Used  Substance and Sexual Activity  . Alcohol use: Yes    Alcohol/week: 1.0 - 2.0 standard drinks    Types: 1 - 2 Cans of beer per week    Comment: 3 times week  . Drug use: No  . Sexual activity: Yes    Partners: Male    Birth control/protection: IUD  Lifestyle  . Physical activity:    Days per week: Not on file    Minutes per session: Not on file  . Stress: Not on file  Relationships  . Social connections:    Talks on phone: Not on file    Gets together: Not on file    Attends religious service: Not on file    Active member of club or organization: Not on file    Attends meetings of clubs or organizations: Not  on file    Relationship status: Not on file  . Intimate partner violence:    Fear of current or ex partner: Not on file    Emotionally abused: Not on file    Physically abused: Not on file    Forced sexual activity: Not on file  Other Topics Concern  . Not on file  Social History Narrative  . Not on file    Family History:  Family History  Problem Relation Age of Onset  . Anemia Mother   . Anemia Maternal Grandfather   . Cancer Maternal Grandfather        COLON/LIVER    Medications Leah Hahn had no medications administered during this visit. Current Outpatient Medications  Medication Sig Dispense Refill  . amphetamine-dextroamphetamine (ADDERALL) 10 MG tablet Take by mouth.    . Levonorgestrel (KYLEENA) 19.5 MG IUD by Intrauterine  route.    . sertraline (ZOLOFT) 50 MG tablet TAKE 1 AND 1/2 TABLETS BY MOUTH DAILY FOR THREE WEEKS. THEN INCREASE TO 2 TABLETS DAILY 60 tablet 2  . diclofenac (VOLTAREN) 75 MG EC tablet Take 1 tablet by mouth twice a day as needed for headaches     No current facility-administered medications for this visit.     Allergies Patient has no known allergies.   Physical Exam:  BP 130/85   Pulse (!) 103   Wt 156 lb 12.8 oz (71.1 kg)   LMP 07/07/2018   SpO2 100%   BMI 24.56 kg/m  Body mass index is 24.56 kg/m. General appearance: Well nourished, well developed female in no acute distress.  Neuro: EOMI, PERRL, CN 2-12 grossly intact, 1+ brachial and patellar reflexes bilaterally. 5/5 strength and normal sensation b/l.  Neck:  Supple, normal appearance, and no thyromegaly  Cardiovascular: normal s1 and s2.  No murmurs, rubs or gallops. Respiratory:  Clear to auscultation bilateral. Normal respiratory effort Abdomen: positive bowel sounds and no masses, hernias; diffusely non tender to palpation, non distended Breasts: +milk like discharge able to be expressed from b/l nipples from multiple ducts. breasts appear normal, no suspicious masses, no skin or axillary nodes. Normal palpation Neuro/Psych:  Normal mood and affect.  Skin:  Warm and dry.  Lymphatic:  No inguinal lymphadenopathy.   Pelvic exam: is not limited by body habitus EGBUS: within normal limits, Vagina: within normal limits and with no blood or discharge in the vault, Cervix: normal appearing cervix without tenderness, discharge or lesions. +IUD strings seen tucked into the left fornix. Uterus:  nonenlarged and non tender and Adnexa:  normal adnexa and no mass, fullness, tenderness Rectovaginal: deferred  Laboratory: none  Radiology: none  Assessment: pt stable  Plan:  1. Galactorrhea She had negative b/l breast ultrasound last year and prolactin. I told her that given the negative evaluation and still non concerning exam  today that this is all reassuring. She's wondering if anything could be done for this. Will repeat labs today and I told her that if her labs negative then can refer to a breast surgeon.  - TSH - CBC - Prolactin - Basic metabolic panel - Beta hCG quant (ref lab)  2. Encounter for well woman exam Routine care  3. Headache Negative exam. If PRL is now elevated then would definitely recommend MRI evaluation to check for adenoma. If negative labs, I told her I'd strongly consider imaging to evaluate the HA since it's different then her migraines, has been ongoing for almost a year. Can d/w her more after labs  are back.    Orders Placed This Encounter  Procedures  . TSH  . CBC  . Prolactin  . Basic metabolic panel  . Beta hCG quant (ref lab)    RTC PRN  Leah Copa MD Attending Center for [facility])

## 2018-07-24 LAB — CBC
HEMOGLOBIN: 13.8 g/dL (ref 11.1–15.9)
Hematocrit: 39.6 % (ref 34.0–46.6)
MCH: 31.8 pg (ref 26.6–33.0)
MCHC: 34.8 g/dL (ref 31.5–35.7)
MCV: 91 fL (ref 79–97)
PLATELETS: 251 10*3/uL (ref 150–450)
RBC: 4.34 x10E6/uL (ref 3.77–5.28)
RDW: 11.9 % — ABNORMAL LOW (ref 12.3–15.4)
WBC: 6.9 10*3/uL (ref 3.4–10.8)

## 2018-07-24 LAB — BASIC METABOLIC PANEL
BUN/Creatinine Ratio: 22 (ref 9–23)
BUN: 16 mg/dL (ref 6–20)
CALCIUM: 9.8 mg/dL (ref 8.7–10.2)
CHLORIDE: 101 mmol/L (ref 96–106)
CO2: 22 mmol/L (ref 20–29)
Creatinine, Ser: 0.72 mg/dL (ref 0.57–1.00)
GFR calc Af Amer: 132 mL/min/{1.73_m2} (ref >59)
GFR calc non Af Amer: 114 mL/min/{1.73_m2} (ref >59)
Glucose: 86 mg/dL (ref 65–99)
POTASSIUM: 4.1 mmol/L (ref 3.5–5.2)
Sodium: 140 mmol/L (ref 134–144)

## 2018-07-24 LAB — PROLACTIN: Prolactin: 9.7 ng/mL (ref 4.8–23.3)

## 2018-07-24 LAB — TSH: TSH: 1.43 u[IU]/mL (ref 0.450–4.500)

## 2018-07-24 LAB — BETA HCG QUANT (REF LAB): hCG Quant: <1 m[IU]/mL

## 2018-12-21 ENCOUNTER — Ambulatory Visit: Payer: Managed Care, Other (non HMO) | Admitting: Obstetrics and Gynecology

## 2018-12-21 ENCOUNTER — Encounter: Payer: Self-pay | Admitting: *Deleted

## 2018-12-21 ENCOUNTER — Encounter: Payer: Self-pay | Admitting: Obstetrics and Gynecology

## 2018-12-21 VITALS — BP 121/84 | HR 88 | Ht 68.0 in | Wt 172.0 lb

## 2018-12-21 DIAGNOSIS — Z30432 Encounter for removal of intrauterine contraceptive device: Secondary | ICD-10-CM

## 2018-12-21 MED ORDER — NORETHINDRONE 0.35 MG PO TABS: 1.0000 | ORAL_TABLET | Freq: Every day | ORAL | 3 refills | Status: DC

## 2018-12-21 NOTE — Procedures (Signed)
Intrauterine Device (IUD) Removal Procedure Note  Patient with Leah Hahn since 06/2017. Patient with weight gain and mood changes and would like to remove the [facility]. She used progestin only OCPs with the birth of her last child several years ago and states she did well with that.   Prior to the procedure being performed, the patient (or guardian) was asked to state their full name, date of birth, and the type of procedure being performed. EGBUS normal. Vaginal vault normal. Cervix normal with IUD strings seen (approx 3 cm in length). Strings grasped with ringed forceps and easily removed and noted to be intact.   No complications, patient tolerated the procedure well.  I d/w her re: paragard IUD and diaphragm and pt to consider those while on POPs. Instructions for use given to patient and told to consider effective in a week. I also told her that she may or may not get a period with POPs  Cornelia Copa MD Attending Center for [employer] (Faculty Practice) 12/21/2018 Time: 1400

## 2018-12-21 NOTE — Progress Notes (Signed)
See procedure note for [facility] removal Pt to start OCPs

## 2018-12-21 NOTE — Progress Notes (Signed)
Would like to get IUD removed and go back on OCP's

## 2019-05-12 ENCOUNTER — Telehealth: Payer: Self-pay | Admitting: *Deleted

## 2019-05-12 NOTE — Telephone Encounter (Signed)
Pt called concerned about some red/pink mucous discharge that started this morning and has been having little cramping for few weeks. Pt is currently pregnant with estimated LMP to be 03/15/2019. Instructed pt to continue to monitor, that cramping and some spotting can be normal in the early stages of pregnancy and if bleeding gets worse or cramping gets worse to call the office back. Pt verbalizes and understands.

## 2019-05-20 ENCOUNTER — Other Ambulatory Visit: Payer: Self-pay | Admitting: *Deleted

## 2019-05-20 MED ORDER — PROMETHAZINE HCL 25 MG PO TABS: 25.0000 mg | ORAL_TABLET | Freq: Four times a day (QID) | ORAL | 2 refills | Status: DC | PRN

## 2019-06-07 NOTE — Progress Notes (Signed)
Pap smear 2018- normal  Babyscripts Decline flu

## 2019-06-08 ENCOUNTER — Other Ambulatory Visit: Payer: Self-pay

## 2019-06-08 ENCOUNTER — Ambulatory Visit (INDEPENDENT_AMBULATORY_CARE_PROVIDER_SITE_OTHER): Payer: Managed Care, Other (non HMO) | Admitting: Obstetrics and Gynecology

## 2019-06-08 ENCOUNTER — Encounter: Payer: Self-pay | Admitting: Obstetrics and Gynecology

## 2019-06-08 VITALS — BP 109/75 | Wt 169.0 lb

## 2019-06-08 DIAGNOSIS — O26899 Other specified pregnancy related conditions, unspecified trimester: Secondary | ICD-10-CM | POA: Insufficient documentation

## 2019-06-08 DIAGNOSIS — Z113 Encounter for screening for infections with a predominantly sexual mode of transmission: Secondary | ICD-10-CM

## 2019-06-08 DIAGNOSIS — Z3480 Encounter for supervision of other normal pregnancy, unspecified trimester: Secondary | ICD-10-CM

## 2019-06-08 DIAGNOSIS — R51 Headache: Secondary | ICD-10-CM

## 2019-06-08 DIAGNOSIS — Z3A08 8 weeks gestation of pregnancy: Secondary | ICD-10-CM

## 2019-06-08 DIAGNOSIS — R519 Headache, unspecified: Secondary | ICD-10-CM | POA: Insufficient documentation

## 2019-06-08 DIAGNOSIS — O26891 Other specified pregnancy related conditions, first trimester: Secondary | ICD-10-CM

## 2019-06-08 MED ORDER — MAGNESIUM MALATE 1250 (141.7 MG) MG PO TABS: 1.0000 | ORAL_TABLET | Freq: Every day | ORAL | 2 refills | Status: DC | PRN

## 2019-06-08 NOTE — Progress Notes (Signed)
New OB Note  06/08/2019   Clinic: [facility]  Chief Complaint: NOB  Transfer of Care Patient: no  History of Present Illness: Leah Hahn is a 28 y.o. G2P1001 @ 8/1 weeks (EDC 4/5, based on 8wk u/s today) Patient's last menstrual period was 03/15/2019 (approximate).  Preg complicated by has Supervision of other normal pregnancy, antepartum; Migraine without aura; Anxiety; Insomnia; Galactorrhea; and Pregnancy headache, antepartum on their problem list.   Any events prior to today's visit: no She was using Plan B when she conceived.  She has Negative signs or symptoms of nausea/vomiting of pregnancy. She has Negative signs or symptoms of miscarriage or preterm labor On any medications around the time she conceived/early pregnancy: zoloft, adderall, Plan B   Patient states b/l milkly nipple d/c is much improved but still occurs. No bleeding or blood  ROS: A 12-point review of systems was performed and negative, except as stated in the above HPI.  OBGYN History: As per HPI. OB History  Gravida Para Term Preterm AB Living  2 1 1     1   SAB TAB Ectopic Multiple Live Births          1    # Outcome Date GA Lbr Len/2nd Weight Sex Delivery Anes PTL Lv  2 Current           1 Term 07/22/14 [redacted]w[redacted]d 04:53 / 02:21 7 lb 4.9 oz (3.315 kg) M Vag-Spont EPI  LIV    Any issues with any prior pregnancies: no Prior children are healthy, doing well, and without any problems or issues: yes History of pap smears: Yes. Last pap smear 2018 and results were negative    Past Medical History: Past Medical History:  Diagnosis Date  . Migraine without aura 03/01/2014    Past Surgical History: Past Surgical History:  Procedure Laterality Date  . COLONOSCOPY  2016  . KNEE SURGERY Left 1992  . WISDOM TOOTH EXTRACTION      Family History:  Family History  Problem Relation Age of Onset  . Anemia Mother   . Anemia Maternal Grandfather   . Cancer Maternal Grandfather         COLON/LIVER   She endorses history of mental retardation, birth defects or genetic disorders in her or the FOB's history: FOB's brother born w/o sweat glands Her father has pectus cavum  Social History:  Social History   Socioeconomic History  . Marital status: Married    Spouse name: Not on file  . Number of children: Not on file  . Years of education: Not on file  . Highest education level: Not on file  Occupational History  . Not on file  Social Needs  . Financial resource strain: Not on file  . Food insecurity    Worry: Not on file    Inability: Not on file  . Transportation needs    Medical: Not on file    Non-medical: Not on file  Tobacco Use  . Smoking status: Never Smoker  . Smokeless tobacco: Never Used  Substance and Sexual Activity  . Alcohol use: Not Currently    Alcohol/week: 1.0 - 2.0 standard drinks    Types: 1 - 2 Cans of beer per week    Comment: 3 times week  . Drug use: No  . Sexual activity: Yes    Partners: Male  Lifestyle  . Physical activity    Days per week: Not on file    Minutes per session: Not on  file  . Stress: Not on file  Relationships  . Social Musicianconnections    Talks on phone: Not on file    Gets together: Not on file    Attends religious service: Not on file    Active member of club or organization: Not on file    Attends meetings of clubs or organizations: Not on file    Relationship status: Not on file  . Intimate partner violence    Fear of current or ex partner: Not on file    Emotionally abused: Not on file    Physically abused: Not on file    Forced sexual activity: Not on file  Other Topics Concern  . Not on file  Social History Narrative  . Not on file     Allergy: No Known Allergies  Health Maintenance:  Mammogram Up to Date: not applicable  Current Outpatient Medications: PNV   Physical Exam:   BP 109/75   Wt 169 lb (76.7 kg)   LMP 03/15/2019 (Approximate)   BMI 25.70 kg/m  Body mass index is  25.7 kg/m.   Vag. Bleeding: None. Fundal height: not applicable FHTs: 170s  General appearance: Well nourished, well developed female in no acute distress.  Neck:  Supple, normal appearance, and no thyromegaly  Cardiovascular: S1, S2 normal, no murmur, rub or gallop, regular rate and rhythm Respiratory:  Clear to auscultation bilateral. Normal respiratory effort Abdomen: positive bowel sounds and no masses, hernias; diffusely non tender to palpation, non distended Breasts: breasts appear normal, no suspicious masses, no skin or nipple changes or axillary nodes, normal palpation. Able to express white, milky d/c from b/l breast in multiple ducts. Neuro/Psych:  Normal mood and affect.  Skin:  Warm and dry.   Laboratory: none  Imaging:  Bedside u/s: SLIUP, 8/1 weeks via CRL, FHR 170s  Assessment: pt doing well  Plan: 1. Pregnancy headache, antepartum Had bad migraines in last pregnancy. Will try qday Mag [place]. Pt told can do daily or prn  2. Supervision of other normal pregnancy, antepartum Routine care. Pt to come back in 10 days for OB labs, nips, carrier screen - Obstetric Panel, Including HIV - Babyscripts Schedule Optimization - Urine Culture - GC/Chlamydia probe amp ([place] - Genetic Screening  Problem list reviewed and updated.  Follow up in 2 weeks.  The nature of [place] [facility] with multiple MDs and other Advanced Practice Providers was explained to patient; also emphasized that residents, students are part of our team.  >50% of 20 min visit spent on counseling and coordination of care.     Leah Hahn, Jr. MD Attending Center for [facility](Faculty Practice)

## 2019-06-09 LAB — GC/CHLAMYDIA PROBE AMP (~~LOC~~) NOT AT ARMC
Chlamydia: NEGATIVE
Neisseria Gonorrhea: NEGATIVE

## 2019-06-10 ENCOUNTER — Other Ambulatory Visit: Payer: Self-pay

## 2019-06-10 MED ORDER — MAGNESIUM MALATE 1250 (141.7 MG) MG PO TABS: 1.0000 | ORAL_TABLET | Freq: Every day | ORAL | 2 refills | Status: DC | PRN

## 2019-06-10 NOTE — Telephone Encounter (Signed)
Patient is requesting magnesium be called into he pharmacy again since they did not received THE RX.

## 2019-06-14 ENCOUNTER — Other Ambulatory Visit: Payer: Self-pay | Admitting: *Deleted

## 2019-06-14 MED ORDER — BUTALBITAL-APAP-CAFFEINE 50-325-40 MG PO CAPS: 1.0000 | ORAL_CAPSULE | Freq: Every day | ORAL | 0 refills | Status: DC | PRN

## 2019-06-14 MED ORDER — MAGNESIUM OXIDE 400 MG PO CAPS: 400.0000 mg | ORAL_CAPSULE | Freq: Every day | ORAL | 3 refills | Status: DC | PRN

## 2019-06-15 MED ORDER — BLOOD PRESSURE MONITOR DELUXE KIT: 1.0000 | PACK | Freq: Once | 0 refills | Status: DC

## 2019-06-15 NOTE — Addendum Note (Signed)
Addended by: Phillip Heal, Adriona Kaney A on: 06/15/2019 03:16 PM   Modules accepted: Orders

## 2019-06-18 ENCOUNTER — Other Ambulatory Visit: Payer: Managed Care, Other (non HMO)

## 2019-06-18 ENCOUNTER — Other Ambulatory Visit: Payer: Self-pay

## 2019-06-18 DIAGNOSIS — Z3480 Encounter for supervision of other normal pregnancy, unspecified trimester: Secondary | ICD-10-CM

## 2019-06-19 LAB — OBSTETRIC PANEL, INCLUDING HIV
Antibody Screen: NEGATIVE
Basophils Absolute: 0 10*3/uL (ref 0.0–0.2)
Basos: 1 %
EOS (ABSOLUTE): 0.1 10*3/uL (ref 0.0–0.4)
Eos: 1 %
HIV Screen 4th Generation wRfx: NONREACTIVE
Hematocrit: 35.8 % (ref 34.0–46.6)
Hemoglobin: 11.8 g/dL (ref 11.1–15.9)
Hepatitis B Surface Ag: NEGATIVE
Immature Grans (Abs): 0 10*3/uL (ref 0.0–0.1)
Immature Granulocytes: 0 %
Lymphocytes Absolute: 1.3 10*3/uL (ref 0.7–3.1)
Lymphs: 30 %
MCH: 29.5 pg (ref 26.6–33.0)
MCHC: 33 g/dL (ref 31.5–35.7)
MCV: 90 fL (ref 79–97)
Monocytes Absolute: 0.3 10*3/uL (ref 0.1–0.9)
Monocytes: 7 %
Neutrophils Absolute: 2.6 10*3/uL (ref 1.4–7.0)
Neutrophils: 61 %
Platelets: 221 10*3/uL (ref 150–450)
RBC: 4 x10E6/uL (ref 3.77–5.28)
RDW: 12.1 % (ref 11.7–15.4)
RPR Ser Ql: NONREACTIVE
Rh Factor: POSITIVE
Rubella Antibodies, IGG: 1.31 index (ref >0.99)
WBC: 4.2 10*3/uL (ref 3.4–10.8)

## 2019-06-20 LAB — URINE CULTURE, OB REFLEX: Organism ID, Bacteria: NO GROWTH

## 2019-06-20 LAB — CULTURE, OB URINE

## 2019-06-28 ENCOUNTER — Telehealth: Payer: Self-pay | Admitting: Radiology

## 2019-06-28 ENCOUNTER — Encounter: Payer: Self-pay | Admitting: Radiology

## 2019-06-28 NOTE — Telephone Encounter (Signed)
Patient informed for Panorama and nateria carrier screening results

## 2019-07-06 ENCOUNTER — Other Ambulatory Visit: Payer: Self-pay

## 2019-07-06 ENCOUNTER — Telehealth (INDEPENDENT_AMBULATORY_CARE_PROVIDER_SITE_OTHER): Payer: Managed Care, Other (non HMO) | Admitting: Obstetrics & Gynecology

## 2019-07-06 ENCOUNTER — Encounter: Payer: Self-pay | Admitting: Obstetrics & Gynecology

## 2019-07-06 DIAGNOSIS — Z3A12 12 weeks gestation of pregnancy: Secondary | ICD-10-CM

## 2019-07-06 DIAGNOSIS — Z3689 Encounter for other specified antenatal screening: Secondary | ICD-10-CM

## 2019-07-06 DIAGNOSIS — F329 Major depressive disorder, single episode, unspecified: Secondary | ICD-10-CM

## 2019-07-06 DIAGNOSIS — Z3480 Encounter for supervision of other normal pregnancy, unspecified trimester: Secondary | ICD-10-CM

## 2019-07-06 DIAGNOSIS — O99341 Other mental disorders complicating pregnancy, first trimester: Secondary | ICD-10-CM

## 2019-07-06 MED ORDER — SERTRALINE HCL 50 MG PO TABS: 50.0000 mg | ORAL_TABLET | Freq: Every day | ORAL | 2 refills | Status: DC

## 2019-07-06 MED ORDER — BLOOD PRESSURE KIT: 1.0000 | PACK | 0 refills | Status: DC

## 2019-07-06 NOTE — Patient Instructions (Signed)
Alpha-Fetoprotein Test Why am I having this test? The alpha-fetoprotein test is most commonly used in pregnant women to help screen for birth defects in their unborn baby. It can be used to screen for birth defects, such as chromosome (DNA) abnormalities, problems with the brain or spinal cord, or problems with the abdominal wall of the unborn baby (fetus). The alpha-fetoprotein test may also be done for men or non-pregnant women to check for certain cancers. What is being tested? This test measures the amount of alpha-fetoprotein (AFP) in your blood. AFP is a protein that is made by the liver. Levels can be detected in the mother's blood during pregnancy, starting at 10 weeks and peaking at 16-18 weeks of the pregnancy. Abnormal levels can sometimes be a sign of a birth defect in the baby. Certain cancers can cause a high level of AFP in men and non-pregnant women. What kind of sample is taken?  A blood sample is required for this test. It is usually collected by inserting a needle into a blood vessel. How are the results reported? Your test results will be reported as values. Your health care provider will compare your results to normal ranges that were established after testing a large group of people (reference values). Reference values may vary among labs and hospitals. For this test, common reference values are:  Adult: Less than 40 ng/mL or less than 40 mcg/L (SI units).  Child younger than 1 year: Less than 30 ng/mL. If you are pregnant, the values may also vary based on how long you have been pregnant. What do the results mean? Results that are above the reference values in pregnant women may indicate the following for the baby:  Neural tube defects, such as abnormalities of the spinal cord or brain.  Abdominal wall defects.  Multiple pregnancy such as twins.  Fetal distress or fetal death. Results that are above the reference values in men or non-pregnant women may indicate:   Reproductive cancers, such as ovarian or testicular cancer.  Liver cancer.  Liver cell death.  Other types of cancer. Very low levels of AFP in pregnant women may indicate the following for the baby:  Down syndrome.  Fetal death. Talk with your health care provider about what your results mean. Questions to ask your health care provider Ask your health care provider, or the department that is doing the test:  When will my results be ready?  How will I get my results?  What are my treatment options?  What other tests do I need?  What are my next steps? Summary  The alpha-fetoprotein test is done on pregnant women to help screen for birth defects in their unborn baby.  Certain cancers can cause a high level of AFP in men and non-pregnant women.  For this test, a blood sample is usually collected by inserting a needle into a blood vessel.  Talk with your health care provider about what your results mean. This information is not intended to replace advice given to you by your health care provider. Make sure you discuss any questions you have with your health care provider. Document Released: 10/24/2004 Document Revised: 09/12/2017 Document Reviewed: 05/06/2017 Elsevier Patient Education  2020 [employer].

## 2019-07-06 NOTE — Progress Notes (Signed)
I connected with  Timya Trimmer O'Ferrell on 07/06/19 at  8:45 AM EDT by telephone and verified that I am speaking with the correct person using two identifiers.   I discussed the limitations, risks, security and privacy concerns of performing an evaluation and management service by telephone and the availability of in person appointments. I also discussed with the patient that there may be a patient responsible charge related to this service. The patient expressed understanding and agreed to proceed.  Crosby Oyster, RN 07/06/2019  8:32 AM

## 2019-07-06 NOTE — Progress Notes (Signed)
   TELEHEALTH OBSTETRICS PRENATAL VIRTUAL VIDEO VISIT ENCOUNTER NOTE  Provider location: [facility] for [place] at [facility]   I connected with Leah Hahn on 07/06/19 at  8:45 AM EDT by MyChart Video Encounter at home and verified that I am speaking with the correct person using two identifiers.   I discussed the limitations, risks, security and privacy concerns of performing an evaluation and management service virtually and the availability of in person appointments. I also discussed with the patient that there may be a patient responsible charge related to this service. The patient expressed understanding and agreed to proceed. Subjective:  Leah Hahn is a 28 y.o. G2P1001 at 56w1dbeing seen today for ongoing prenatal care.  She is currently monitored for the following issues for this high-risk pregnancy and has Supervision of other normal pregnancy, antepartum; Migraine without aura; Anxiety; Insomnia; Galactorrhea; and Pregnancy headache, antepartum on their problem list.  Patient reports feeling slightly more depressed, currently on Zoloft 25 mg daily.  No SI/HI reported.  Contractions: Irritability. Vag. Bleeding: None.   . Denies any leaking of fluid.   The following portions of the patient's history were reviewed and updated as appropriate: allergies, current medications, past family history, past medical history, past social history, past surgical history and problem list.   Objective:  There were no vitals filed for this visit.  General:  Alert, oriented and cooperative. Patient is in no acute distress.  Respiratory: Normal respiratory effort, no problems with respiration noted  Mental Status: Normal mood and affect. Normal behavior. Normal judgment and thought content.  Rest of physical exam deferred due to type of encounter   Assessment and Plan:  Pregnancy: G2P1001 at 167w1d. Depression affecting pregnancy in first trimester,  antepartum Was on 25 mg, will increase to 50 mg to help with symptoms.  Will continue to monitor.  - sertraline (ZOLOFT) 50 MG tablet; Take 1 tablet (50 mg total) by mouth daily.  Dispense: 90 tablet; Refill: 2  2. Encounter for fetal anatomic survey Anatomy scan ordered - [place] OB COMP + 14 WK; Future  3. Supervision of other normal pregnancy, antepartum Low risk NIPS. Offered AFP, she will consider this. If she agrees, will be done on same day as her anatomy scan at [facility]. - Blood Pressure KIT; 1 Device by Does not apply route once a week. To be monitored weekly from home  Dispense: 1 kit; Refill: 0 - [place] OB COMP + 14 WK; Future No other complaints or concerns.  Routine obstetric precautions reviewed.  I discussed the assessment and treatment plan with the patient. The patient was provided an opportunity to ask questions and all were answered. The patient agreed with the plan and demonstrated an understanding of the instructions. The patient was advised to call back or seek an in-person office evaluation/[facility] or concerning [facility]. Please refer to After Visit Summary for other counseling recommendations.   I provided 15 minutes of face-to-face time during this encounter.  Return in about 4 weeks (around 08/03/2019) for Virtual OB Visit.  Future Appointments  Date Time Provider [place]10/20/2020  9:15 AM PrDonnamae JudeMD [facility]    UgVerita SchneidersMD [facility]

## 2019-08-03 ENCOUNTER — Other Ambulatory Visit: Payer: Self-pay

## 2019-08-03 ENCOUNTER — Telehealth (INDEPENDENT_AMBULATORY_CARE_PROVIDER_SITE_OTHER): Payer: Managed Care, Other (non HMO) | Admitting: Family Medicine

## 2019-08-03 ENCOUNTER — Encounter: Payer: Self-pay | Admitting: Family Medicine

## 2019-08-03 VITALS — BP 119/83 | HR 72 | Wt 162.0 lb

## 2019-08-03 DIAGNOSIS — Z3480 Encounter for supervision of other normal pregnancy, unspecified trimester: Secondary | ICD-10-CM

## 2019-08-03 DIAGNOSIS — F329 Major depressive disorder, single episode, unspecified: Secondary | ICD-10-CM

## 2019-08-03 DIAGNOSIS — Z3A16 16 weeks gestation of pregnancy: Secondary | ICD-10-CM

## 2019-08-03 DIAGNOSIS — F419 Anxiety disorder, unspecified: Secondary | ICD-10-CM

## 2019-08-03 DIAGNOSIS — O99341 Other mental disorders complicating pregnancy, first trimester: Secondary | ICD-10-CM

## 2019-08-03 DIAGNOSIS — O99342 Other mental disorders complicating pregnancy, second trimester: Secondary | ICD-10-CM

## 2019-08-03 MED ORDER — SERTRALINE HCL 100 MG PO TABS: 100.0000 mg | ORAL_TABLET | Freq: Every day | ORAL | 2 refills | Status: DC

## 2019-08-03 NOTE — Progress Notes (Addendum)
   TELEHEALTH OBSTETRICS PRENATAL VIRTUAL VIDEO VISIT ENCOUNTER NOTE  Provider location: [facility] for [place] at [facility]   I connected with Leah Hahn on 08/03/19 at  9:15 AM EDT by Doximity Video Encounter at home and verified that I am speaking with the correct person using two identifiers.   I discussed the limitations, risks, security and privacy concerns of performing an evaluation and management service virtually and the availability of in person appointments. I also discussed with the patient that there may be a patient responsible charge related to this service. The patient expressed understanding and agreed to proceed. Subjective:  Leah Hahn is a 28 y.o. G2P1001 at [redacted]w[redacted]d being seen today for ongoing prenatal care.  She is currently monitored for the following issues for this low-risk pregnancy and has Supervision of other normal pregnancy, antepartum; Migraine without aura; Anxiety; Insomnia; Galactorrhea; and Pregnancy headache, antepartum on their problem list.  Patient reports anxiety and stress and pain in her feet, though has been up on them all day with recent move..   .  .  Movement: Present. Denies any leaking of fluid.   The following portions of the patient's history were reviewed and updated as appropriate: allergies, current medications, past family history, past medical history, past social history, past surgical history and problem list.   Objective:   Vitals:   08/03/19 0858  BP: 119/83  Pulse: 72  Weight: 162 lb (73.5 kg)    Fetal Status:     Movement: Present     General:  Alert, oriented and cooperative. Patient is in no acute distress.  Respiratory: Normal respiratory effort, no problems with respiration noted  Mental Status: Normal mood and affect. Normal behavior. Normal judgment and thought content.  Rest of physical exam deferred due to type of encounter  Imaging: No results found.  Assessment and Plan:   Pregnancy: G2P1001 at [redacted]w[redacted]d 1. Supervision of other normal pregnancy, antepartum Continue routine prenatal care. Self care Good supportive shoes, compression socks  2. Anxiety Will refer to [facility] - Ambulatory referral to [place]  3. Depression affecting pregnancy in first trimester, antepartum Have increased her Zoloft to 100, more likely to be a therapeutic dose. - sertraline (ZOLOFT) 100 MG tablet; Take 1 tablet (100 mg total) by mouth daily.  Dispense: 90 tablet; Refill: 2  General obstetric precautions including but not limited to vaginal bleeding, contractions, leaking of fluid and fetal movement were reviewed in detail with the patient. I discussed the assessment and treatment plan with the patient. The patient was provided an opportunity to ask questions and all were answered. The patient agreed with the plan and demonstrated an understanding of the instructions. The patient was advised to call back or seek an in-person office evaluation/go to [facility] at [facility] for any urgent or concerning symptoms. Please refer to After Visit Summary for other counseling recommendations.   I provided 12 minutes of face-to-face time during this encounter.  Return in 4 weeks (on 08/31/2019) for virtual.  Future Appointments  Date Time Provider [place]  08/23/2019  9:30 AM [facility]-MFC [place] 1 [facility]-MFCUS MFC-US  08/31/2019  8:30 AM Emily Filbert, MD [place], [place] for [facility] [place]

## 2019-08-03 NOTE — Progress Notes (Signed)
  I connected with  Briseidy Spark O'Ferrell on 08/03/19 at  9:15 AM EDT by telephone and verified that I am speaking with the correct person using two identifiers.   I discussed the limitations, risks, security and privacy concerns of performing an evaluation and management service by telephone and the availability of in person appointments. I also discussed with the patient that there may be a patient responsible charge related to this service. The patient expressed understanding and agreed to proceed.  Kendel Bessey Jeanella Anton, CMA 08/03/2019  9:03 AM  B/p 119/83

## 2019-08-03 NOTE — Patient Instructions (Signed)

## 2019-08-09 ENCOUNTER — Telehealth: Payer: Self-pay | Admitting: *Deleted

## 2019-08-09 NOTE — Telephone Encounter (Signed)
Pt called to let us now that she saw her PCP and she had a low BP of 92/60 and due to that her PCP was going to run some lab work. Informed pt to keep [place] updated of her BP and lab work and to increase her fluid  intake and to let us now if she continues to have low BP's. Pt verbalizes and understands.

## 2019-08-09 NOTE — Telephone Encounter (Signed)
Pt called to let us know she heard back from her PCP and they said her B-12 was low and she was slightly anemic and so they started her on  B-12 and iron supplements.

## 2019-08-18 ENCOUNTER — Other Ambulatory Visit: Payer: Self-pay

## 2019-08-18 ENCOUNTER — Ambulatory Visit (INDEPENDENT_AMBULATORY_CARE_PROVIDER_SITE_OTHER): Payer: Managed Care, Other (non HMO) | Admitting: Clinical

## 2019-08-18 DIAGNOSIS — F4323 Adjustment disorder with mixed anxiety and depressed mood: Secondary | ICD-10-CM

## 2019-08-18 NOTE — BH Specialist Note (Signed)
Integrated Behavioral Health via Telemedicine Video Visit  08/18/2019 Lindora Alviar Hahn [id number]  Number of [place] visits: 1 Session Start time: 1:18  Session End time: 2:03  Total time: 45   Referring Provider: Darron Doom, MD for anxiety Type of Visit: Video Patient/Family location: Home [facility] Provider location: [facility] All persons participating in visit: Patient Leah Hahn, [place]   Confirmed patient's address: Yes  Confirmed patient's phone number: Yes  Any changes to demographics: No   Confirmed patient's insurance: Yes  Any changes to patient's insurance: No   Discussed confidentiality: Yes   I connected with Leah Hahn by a video enabled telemedicine application and verified that I am speaking with the correct person using two identifiers.     I discussed the limitations of evaluation and management by telemedicine and the availability of in person appointments.  I discussed that the purpose of this visit is to provide behavioral health care while limiting exposure to the novel coronavirus.   Discussed there is a possibility of technology failure and discussed alternative modes of communication if that failure occurs.  I discussed that engaging in this video visit, they consent to the provision of behavioral healthcare and the services will be billed under their insurance.  Patient and/or legal guardian expressed understanding and consented to video visit: Yes   PRESENTING CONCERNS: Patient and/or family reports the following symptoms/concerns: Pt states her primary concern today is experiencing recent stressful events (moved into new home, husband's truck first night; bathroom flooding first week); pt copes with stress by detaching and taking a few moments to herself, and taking Zoloft; pt is open to learning additional self-coping strategy today.  Duration of problem: Increase in past three weeks; ongoing  depression/anxiety; Severity of problem: moderate  STRENGTHS (Protective Factors/Coping Skills): Self-coping skills, social support; open to treatment  GOALS ADDRESSED: Patient will: 1.  Reduce symptoms of: anxiety, depression and stress  2.  Increase knowledge and/or ability of: coping skills and healthy habits  3.  Demonstrate ability to: Increase healthy adjustment to current life circumstances  INTERVENTIONS: Interventions utilized:  Mindfulness or Psychologist, educational and Psychoeducation and/or Health Education Standardized Assessments completed: GAD-7 and PHQ 9  ASSESSMENT: Patient currently experiencing Adjustment disorder with mixed anxiety and depression  Patient may benefit from psychoeducation and brief therapeutic interventions regarding coping with symptoms of anxiety, stress, and depression  .  PLAN: 1. Follow up with behavioral health clinician on : Two weeks 2. Behavioral recommendations:  -Continue taking Zoloft as prescribed -CALM relaxation breathing exercise (daytime; at bedtime) -Stop, Breathe, and Think app, as needed 3. Referral(s): [place] (In Clinic)  I discussed the assessment and treatment plan with the patient and/or parent/guardian. They were provided an opportunity to ask questions and all were answered. They agreed with the plan and demonstrated an understanding of the instructions.   They were advised to call back or seek an in-person evaluation if the symptoms worsen or if the condition fails to improve as anticipated.  [name]   Depression screen [facility] 2/9 08/18/2019  Decreased Interest 1  Down, Depressed, Hopeless 1  PHQ - 2 Score 2  Altered sleeping 3  Tired, decreased energy 3  Change in appetite 0  Feeling bad or failure about yourself  0  Trouble concentrating 1  Moving slowly or fidgety/restless 0  Suicidal thoughts 0  PHQ-9 Score 9   GAD 7 : Generalized Anxiety Score 08/18/2019  Nervous,  Anxious, on Edge 1  Control/stop worrying 0  Worry too much - different things 1  Trouble relaxing 3  Restless 0  Easily annoyed or irritable 3  Afraid - awful might happen 1  Total GAD 7 Score 9

## 2019-08-23 ENCOUNTER — Other Ambulatory Visit (HOSPITAL_COMMUNITY): Payer: Self-pay | Admitting: *Deleted

## 2019-08-23 ENCOUNTER — Ambulatory Visit (HOSPITAL_COMMUNITY)
Admission: RE | Admit: 2019-08-23 | Discharge: 2019-08-23 | Disposition: A | Discharge status: Home / Self Care | Payer: Managed Care, Other (non HMO) | Source: Ambulatory Visit | Attending: Obstetrics and Gynecology | Admitting: Obstetrics and Gynecology

## 2019-08-23 ENCOUNTER — Other Ambulatory Visit: Payer: Self-pay

## 2019-08-23 DIAGNOSIS — Z3689 Encounter for other specified antenatal screening: Secondary | ICD-10-CM | POA: Diagnosis present

## 2019-08-23 DIAGNOSIS — Z3A19 19 weeks gestation of pregnancy: Secondary | ICD-10-CM | POA: Diagnosis not present

## 2019-08-23 DIAGNOSIS — Z363 Encounter for antenatal screening for malformations: Secondary | ICD-10-CM | POA: Diagnosis not present

## 2019-08-23 DIAGNOSIS — Z3480 Encounter for supervision of other normal pregnancy, unspecified trimester: Secondary | ICD-10-CM | POA: Diagnosis present

## 2019-08-23 DIAGNOSIS — Z362 Encounter for other antenatal screening follow-up: Secondary | ICD-10-CM

## 2019-08-31 ENCOUNTER — Telehealth: Payer: Self-pay | Admitting: Clinical

## 2019-08-31 ENCOUNTER — Other Ambulatory Visit: Payer: Self-pay

## 2019-08-31 ENCOUNTER — Telehealth (INDEPENDENT_AMBULATORY_CARE_PROVIDER_SITE_OTHER): Payer: Managed Care, Other (non HMO) | Admitting: Obstetrics & Gynecology

## 2019-08-31 DIAGNOSIS — Z3482 Encounter for supervision of other normal pregnancy, second trimester: Secondary | ICD-10-CM

## 2019-08-31 DIAGNOSIS — Z3A20 20 weeks gestation of pregnancy: Secondary | ICD-10-CM

## 2019-08-31 DIAGNOSIS — Z3480 Encounter for supervision of other normal pregnancy, unspecified trimester: Secondary | ICD-10-CM

## 2019-08-31 NOTE — Progress Notes (Signed)
I connected with  Leah Hahn on 08/31/19 at  8:30 AM EST by telephone and verified that I am speaking with the correct person using two identifiers.   I discussed the limitations, risks, security and privacy concerns of performing an evaluation and management service by telephone and the availability of in person appointments. I also discussed with the patient that there may be a patient responsible charge related to this service. The patient expressed understanding and agreed to proceed.  Crosby Oyster, RN 08/31/2019  8:35 AM

## 2019-08-31 NOTE — BH Specialist Note (Deleted)
Integrated Behavioral Health via Telemedicine Video Visit  08/31/2019 Leah Hahn [id number]  Number of Leah Hahn visits: 2 Session Start time: 10:45***  Session End time: 11:15*** Total time: {IBH Total ELFY:10175102}  Referring Provider: Darron Doom, MD Type of Visit: Video Patient/Family location[facility] Provider location: [facility] All persons participating in visit: Patient Leah Hahn and Leah Hahn  Confirmed patient's address: Yes  Confirmed patient's phone number: Yes  Any changes to demographics: No   Confirmed patient's insurance: Yes  Any changes to patient's insurance: No   Discussed confidentiality: At previous visit  I connected with Leah Hahn by a video enabled telemedicine application and verified that I am speaking with the correct person using two identifiers.     I discussed the limitations of evaluation and management by telemedicine and the availability of in person appointments.  I discussed that the purpose of this visit is to provide behavioral health care while limiting exposure to the novel coronavirus.   Discussed there is a possibility of technology failure and discussed alternative modes of communication if that failure occurs.  I discussed that engaging in this video visit, they consent to the provision of behavioral healthcare and the services will be billed under their insurance.  Patient and/or legal guardian expressed understanding and consented to video visit: Yes   PRESENTING CONCERNS: Patient and/or family reports the following symptoms/concerns: *** Duration of problem: Increase over one month; Severity of problem: moderate***  STRENGTHS (Protective Factors/Coping Skills): Social support, self-coping skills and open to treatment  GOALS ADDRESSED: Patient will: 1.  Reduce symptoms of: anxiety, depression and stress *** 2.  Increase knowledge and/or ability of: {IBH  Patient Tools:21014057}  3.  Demonstrate ability to: {IBH Goals:21014053}  INTERVENTIONS: Interventions utilized:  {IBH Interventions:21014054} Standardized Assessments completed: {IBH Screening Tools:21014051}  ASSESSMENT: Patient currently experiencing Adjustment disorder with mixed anxiety and depression***.   Patient may benefit from continued brief therapeutic interventions ***.  PLAN: 1. Follow up with behavioral health clinician on : *** 2. Behavioral recommendations:  -*** -***zoloft/calm/app*** 3. Referral(s): {IBH Referrals:21014055}  I discussed the assessment and treatment plan with the patient and/or parent/guardian. They were provided an opportunity to ask questions and all were answered. They agreed with the plan and demonstrated an understanding of the instructions.   They were advised to call back or seek an in-person evaluation if the symptoms worsen or if the condition fails to improve as anticipated.  [name] Frimet Durfee

## 2019-08-31 NOTE — Progress Notes (Signed)
TELEHEALTH OBSTETRICS PRENATAL VIRTUAL VIDEO VISIT ENCOUNTER NOTE  Provider location: [facility] for [facility]   I connected with Leah Hahn on 08/31/19 at  8:30 AM EST by MyChart Video Encounter at home and verified that I am speaking with the correct person using two identifiers.   I discussed the limitations, risks, security and privacy concerns of performing an evaluation and management service virtually and the availability of in person appointments. I also discussed with the patient that there may be a patient responsible charge related to this service. The patient expressed understanding and agreed to proceed. Subjective:  Leah Hahn is a 28 y.o. married G2P1001 (5 yo son) at [redacted]w[redacted]d being seen today for ongoing prenatal care.  She is currently monitored for the following issues for this low-risk pregnancy and has Supervision of other normal pregnancy, antepartum; Migraine without aura; Anxiety; Insomnia; Galactorrhea; and Pregnancy headache, antepartum on their problem list.  Patient reports no complaints.  Contractions: Not present. Vag. Bleeding: None.  Movement: Present. Denies any leaking of fluid.   The following portions of the patient's history were reviewed and updated as appropriate: allergies, current medications, past family history, past medical history, past social history, past surgical history and problem list.   Objective:   Vitals:   08/31/19 0834  BP: 96/65    Fetal Status:     Movement: Present     General:  Alert, oriented and cooperative. Patient is in no acute distress.  Respiratory: Normal respiratory effort, no problems with respiration noted  Mental Status: Normal mood and affect. Normal behavior. Normal judgment and thought content.  Rest of physical exam deferred due to type of encounter  Imaging: [place] Mfm Ob Comp + 14 Wk  Result Date: 08/23/2019  ----------------------------------------------------------------------  OBSTETRICS REPORT                       (Signed Final 08/23/2019 10:53 am) ---------------------------------------------------------------------- Patient Info  ID #:       [id number]                          D.O.B.:  1991/07/09 (28 yrs)  Name:       Leah Hahn                  Visit Date: 08/23/2019 09:41 am              Hahn ---------------------------------------------------------------------- Performed By  Performed By:     Tomma Lightning             Ref. Address:     [address]                                                             [place],  [place]                                                             [address]  Attending:        Tama High MD        [facility]  Referred By:      Osborne Oman MD ---------------------------------------------------------------------- Orders   #  Description                          Code         Ordered By   1  [place] MFM OB COMP + 28 WK               18299.37     Verita Schneiders  ----------------------------------------------------------------------   #  Order #                    Accession #                 Episode #   1  [id number]                  [id number]                  [id number]  ---------------------------------------------------------------------- Indications   Encounter for antenatal screening for          Z36.3   malformations   [redacted] weeks gestation of pregnancy (Low risk      Z3A.19   NIPS)  ---------------------------------------------------------------------- Fetal Evaluation  Num Of Fetuses:         1  Fetal Heart Rate(bpm):  157  Cardiac Activity:       Observed  Presentation:           Breech  Placenta:               Anterior  P. Cord Insertion:      Visualized  Amniotic  Fluid  AFI FV:      Within normal limits                              Largest Pocket(cm)                              5.22 ---------------------------------------------------------------------- Biometry  BPD:      46.6  mm     G. Age:  20w 1d         89  %    CI:         70.8   %    70 - 86  FL/HC:      18.5   %    16.1 - 18.3  HC:      176.5  mm     G. Age:  20w 1d         89  %    HC/AC:      1.20        1.09 - 1.39  AC:      147.3  mm     G. Age:  20w 0d         78  %    FL/BPD:     70.2   %  FL:       32.7  mm     G. Age:  20w 1d         84  %    FL/AC:      22.2   %    20 - 24  HUM:      30.8  mm     G. Age:  20w 2d         82  %  CER:      20.2  mm     G. Age:  19w 1d         55  %  NFT:       4.1  mm  LV:        6.7  mm  CM:          6  mm  Est. FW:     333  gm    0 lb 12 oz      96  % ---------------------------------------------------------------------- OB History  Gravidity:    2         Term:   1  Living:       1 ---------------------------------------------------------------------- Gestational Age  LMP:           23w 0d        Date:  03/15/19                 EDD:   12/20/19  U/S Today:     20w 1d                                        EDD:   01/09/20  Best:          19w 0d     Det. By:  Marcella Dubs         EDD:   01/17/20                                      (06/08/19) ---------------------------------------------------------------------- Anatomy  Cranium:               Appears normal         LVOT:                   Appears normal  Cavum:                 Appears normal         Aortic Arch:            Appears normal  Ventricles:            Appears normal         Ductal Arch:  Appears normal  Choroid Plexus:        Appears normal         Diaphragm:              Appears normal  Cerebellum:            Appears normal         Stomach:                Appears normal, left                                                                         sided  Posterior Fossa:       Appears normal         Abdomen:                Appears normal  Nuchal Fold:           Appears normal         Abdominal Wall:         Appears nml (cord                                                                        insert, abd wall)  Face:                  Appears normal         Cord Vessels:           Appears normal (3                         (orbits and profile)                           vessel cord)  Lips:                  Appears normal         Kidneys:                Appear normal  Palate:                Not well visualized    Bladder:                Appears normal  Thoracic:              Appears normal         Spine:                  Appears normal  Heart:                 Appears normal         Upper Extremities:      Appears normal                         (4CH, axis, and  situs)  RVOT:                  Appears normal         Lower Extremities:      Appears normal  Other:  Fetus appears to be a female. Heels visualized. Open hands visualized.          Nasal bone visualized. Technically difficult due to fetal position. ---------------------------------------------------------------------- Cervix Uterus Adnexa  Cervix  Length:           3.25  cm.  Normal appearance by transabdominal scan.  Uterus  No abnormality visualized. ---------------------------------------------------------------------- Impression  G2 P1.  Patient is here for fetal anatomy scan.  On cell free  fetal DNA screening the risks of fetal aneuploidies are not  increased.  Her pregnancy is dated by 8-week ultrasound.  We performed fetal anatomy scan. No makers of  aneuploidies or fetal structural defects are seen. Fetal  biometry is consistent with 20w 1d gestation (8-days ahead).  Amniotic fluid is normal and good fetal activity is seen.  Patient understands the limitations of ultrasound in detecting  fetal anomalies. ----------------------------------------------------------------------  Recommendations  -An appointment was made for her to return in 4 weeks for  interval fetal growth assessment. ----------------------------------------------------------------------                  Noralee Space, MD Electronically Signed Final Report   08/23/2019 10:53 am ----------------------------------------------------------------------   Assessment and Plan:  Pregnancy: G2P1001 at [redacted]w[redacted]d There are no diagnoses linked to this encounter. Preterm labor symptoms and general obstetric precautions including but not limited to vaginal bleeding, contractions, leaking of fluid and fetal movement were reviewed in detail with the patient. I discussed the assessment and treatment plan with the patient. The patient was provided an opportunity to ask questions and all were answered. The patient agreed with the plan and demonstrated an understanding of the instructions. The patient was advised to call back or seek an in-person office evaluation/go to MAU at [facility] for any urgent or concerning symptoms. Please refer to After Visit Summary for other counseling recommendations.   I provided 10 minutes of face-to-face time during this encounter.  No follow-ups on file.  Future Appointments  Date Time Provider Department Center  09/01/2019 10:45 AM [facility] HEALTH Rosalie Gums WOC-WOCA WOC  09/21/2019  7:45 AM WH-MFC [place] 2 WH-MFCUS MFC-US    Allie Bossier, MD Center for [employer], [facility]

## 2019-08-31 NOTE — Telephone Encounter (Signed)
Spoke to patient about her virtual appointment w/ Roselyn Reef on 11/18 @ 10:45. Patient stated that she needs to reschedule due to a work meeting that she found out about last minute. Patient was rescheduled to 11/19 @ 10:15. Patient instructed that Roselyn Reef will be giving her a call around her appointment time w/ further instructions on how to do the visit virtually. Patient instructed that she does not have to come to the office for the appointment. Patient verbalized understanding.

## 2019-09-01 ENCOUNTER — Ambulatory Visit: Payer: Managed Care, Other (non HMO)

## 2019-09-02 ENCOUNTER — Other Ambulatory Visit: Payer: Self-pay

## 2019-09-02 ENCOUNTER — Ambulatory Visit: Payer: Managed Care, Other (non HMO) | Admitting: Clinical

## 2019-09-02 DIAGNOSIS — Z91199 Patient's noncompliance with other medical treatment and regimen due to unspecified reason: Secondary | ICD-10-CM

## 2019-09-02 DIAGNOSIS — Z5329 Procedure and treatment not carried out because of patient's decision for other reasons: Secondary | ICD-10-CM

## 2019-09-02 NOTE — BH Specialist Note (Signed)
Pt did not arrive to video visit and did not answer the phone ; Left HIPPA-compliant message to call back Roselyn Reef from [facility] at [phone].  ; left MyChart message for patient.    [place] via Telemedicine Video Visit  09/02/2019 Maddox Bratcher O'Ferrell [phone]  Garlan Fair

## 2019-09-21 ENCOUNTER — Ambulatory Visit (HOSPITAL_COMMUNITY)
Admission: RE | Admit: 2019-09-21 | Discharge: 2019-09-21 | Disposition: A | Discharge status: Home / Self Care | Payer: 59 | Source: Ambulatory Visit | Attending: Obstetrics and Gynecology | Admitting: Obstetrics and Gynecology

## 2019-09-21 ENCOUNTER — Other Ambulatory Visit: Payer: Self-pay

## 2019-09-21 DIAGNOSIS — Z3A23 23 weeks gestation of pregnancy: Secondary | ICD-10-CM | POA: Diagnosis not present

## 2019-09-21 DIAGNOSIS — Z362 Encounter for other antenatal screening follow-up: Secondary | ICD-10-CM | POA: Diagnosis present

## 2019-09-27 ENCOUNTER — Encounter (HOSPITAL_COMMUNITY): Payer: Self-pay | Admitting: Obstetrics & Gynecology

## 2019-09-27 ENCOUNTER — Inpatient Hospital Stay (HOSPITAL_COMMUNITY)
Admission: AD | Admit: 2019-09-27 | Discharge: 2019-09-27 | Disposition: A | Discharge status: Home / Self Care | Payer: 59 | Attending: Obstetrics & Gynecology | Admitting: Obstetrics & Gynecology

## 2019-09-27 ENCOUNTER — Other Ambulatory Visit: Payer: Self-pay

## 2019-09-27 DIAGNOSIS — Z3A24 24 weeks gestation of pregnancy: Secondary | ICD-10-CM

## 2019-09-27 DIAGNOSIS — O26892 Other specified pregnancy related conditions, second trimester: Secondary | ICD-10-CM | POA: Diagnosis not present

## 2019-09-27 DIAGNOSIS — O99891 Other specified diseases and conditions complicating pregnancy: Secondary | ICD-10-CM | POA: Diagnosis not present

## 2019-09-27 DIAGNOSIS — M549 Dorsalgia, unspecified: Secondary | ICD-10-CM

## 2019-09-27 DIAGNOSIS — M545 Low back pain: Secondary | ICD-10-CM | POA: Diagnosis present

## 2019-09-27 LAB — URINALYSIS, ROUTINE W REFLEX MICROSCOPIC
Bilirubin Urine: NEGATIVE
Glucose, UA: NEGATIVE mg/dL
Hgb urine dipstick: NEGATIVE
Ketones, ur: NEGATIVE mg/dL
Nitrite: NEGATIVE
Protein, ur: NEGATIVE mg/dL
Specific Gravity, Urine: 1.005 (ref 1.005–1.030)
pH: 6 (ref 5.0–8.0)

## 2019-09-27 MED ORDER — COMFORT FIT MATERNITY SUPP SM MISC: 1.0000 [IU] | Freq: Every day | 0 refills | Status: DC | PRN

## 2019-09-27 NOTE — MAU Provider Note (Signed)
Chief Complaint:  Back Pain and Nausea   First Provider Initiated Contact with Patient 09/27/19 1654     HPI: Leah Hahn is a 28 y.o. G2P1001 at [redacted]w[redacted]d who presents to maternity admissions reporting back pain. Symptoms started a few days ago. Reports sharp aching pressure in her left lower back. Pain does not radiate but does occasionally cause numbness down her left leg. Pain is intermittent, worse with walking and position changes. Has had some nausea today that she relates to the pain. Denies fever/chills, vomiting, flank pain, dysuria, LOF, vaginal bleeding, or abdominal pain. No urinary/fecal incontinence, or saddle numbness.    Location: back Quality: pressure Severity: 8/10 in pain scale Duration: 2 days Timing: intermittent Modifying factors: worse with walking Associated signs and symptoms: nausea   Past Medical History:  Diagnosis Date  . Migraine without aura 03/01/2014   OB History  Gravida Para Term Preterm AB Living  2 1 1     1  SAB TAB Ectopic Multiple Live Births          1    # Outcome Date GA Lbr Len/2nd Weight Sex Delivery Anes PTL Lv  2 Current           1 Term 07/22/14 [redacted]w[redacted]d 04:53 / 02:21 3315 g M Vag-Spont EPI  LIV   Past Surgical History:  Procedure Laterality Date  . COLONOSCOPY  2016  . KNEE SURGERY Left 1992  . WISDOM TOOTH EXTRACTION     Family History  Problem Relation Age of Onset  . Anemia Mother   . Anemia Maternal Grandfather   . Cancer Maternal Grandfather        COLON/LIVER   Social History   Tobacco Use  . Smoking status: Never Smoker  . Smokeless tobacco: Never Used  Substance Use Topics  . Alcohol use: Not Currently    Alcohol/week: 1.0 - 2.0 standard drinks    Types: 1 - 2 Cans of beer per week    Comment: 3 times week  . Drug use: No   No Known Allergies No medications prior to admission.    I have reviewed patient's Past Medical Hx, Surgical Hx, Family Hx, Social Hx, medications and allergies.   ROS:   Review of Systems  Constitutional: Negative.   Gastrointestinal: Positive for nausea. Negative for abdominal pain, constipation, diarrhea and vomiting.  Genitourinary: Negative.   Musculoskeletal: Positive for back pain.    Physical Exam   Patient Vitals for the past 24 hrs:  BP Temp Temp src Pulse Resp SpO2 Height Weight  09/27/19 1722 -- -- -- -- 18 -- -- --  09/27/19 1555 119/65 98.2 F (36.8 C) Oral 72 18 100 % 5' 7" (1.702 m) 75.8 kg    Constitutional: Well-developed, well-nourished female in no acute distress.  Cardiovascular: normal rate & rhythm, no murmur. DP pulses 2+ Respiratory: normal effort, lung sounds clear throughout GI: Abd soft, non-tender, gravid appropriate for gestational age. Pos BS x 4 MS: Extremities nontender, no edema, normal ROM Neurologic: Alert and oriented x 4. Bilateral patellar DTRs 2+   NST:  Baseline: 150 bpm, Variability: Good {> 6 bpm), Accelerations: Non-reactive but appropriate for gestational age and Decelerations: Absent   Labs: Results for orders placed or performed during the hospital encounter of 09/27/19 (from the past 24 hour(s))  Urinalysis, Routine w reflex microscopic     Status: Abnormal   Collection Time: 09/27/19  2:40 PM  Result Value Ref Range   Color, Urine STRAW (A)   YELLOW   APPearance CLEAR CLEAR   Specific Gravity, Urine 1.005 1.005 - 1.030   pH 6.0 5.0 - 8.0   Glucose, UA NEGATIVE NEGATIVE mg/dL   Hgb urine dipstick NEGATIVE NEGATIVE   Bilirubin Urine NEGATIVE NEGATIVE   Ketones, ur NEGATIVE NEGATIVE mg/dL   Protein, ur NEGATIVE NEGATIVE mg/dL   Nitrite NEGATIVE NEGATIVE   Leukocytes,Ua TRACE (A) NEGATIVE   RBC / HPF 0-5 0 - 5 RBC/hpf   WBC, UA 0-5 0 - 5 WBC/hpf   Bacteria, UA RARE (A) NONE SEEN   Squamous Epithelial / LPF 0-5 0 - 5    Imaging:  No results found.  MAU Course: Orders Placed This Encounter  Procedures  . Culture, OB Urine  . Urinalysis, Routine w reflex microscopic  . Discharge patient    Meds ordered this encounter  Medications  . Elastic Bandages & Supports (COMFORT FIT MATERNITY SUPP SM) MISC    Sig: 1 Units by Does not apply route daily as needed.    Dispense:  1 each    Refill:  0    Order Specific Question:   Supervising Provider    Answer:   ARNOLD, JAMES G [[id number]]    MDM: Fetal tracing appropriate for gestation. No ob complaints today  U/a with trace leuks. Will send for urine culture. Pt has not urinary complaints & negative CVAT. Nausea likely due to pain.  No red flag symptoms.  Discussed use of maternity support belt, tylenol, warm baths. If symptoms worse with the pregnancy may need PT consult.   Assessment: 1. Back pain affecting pregnancy in second trimester   2. [redacted] weeks gestation of pregnancy     Plan: Discharge home in stable condition.  Preterm Labor precautions and fetal kick counts Urine culture pending Rx maternity support belt  Follow-up Information    [facility] Follow up.   Contact information: [address] [place] [address] [phone]           Allergies as of 09/27/2019   No Known Allergies     Medication List    STOP taking these medications   Butalbital-APAP-Caffeine 50-325-40 MG capsule   Magnesium Malate 1250 (141.7 Mg) MG Tabs   Magnesium Oxide 400 MG Caps   promethazine 25 MG tablet Commonly known as: PHENERGAN     TAKE these medications   Blood Pressure Kit 1 Device by Does not apply route once a week. To be monitored weekly from home   Comfort Fit Maternity Supp Sm Misc 1 Units by Does not apply route daily as needed.   ferrous sulfate 325 (65 FE) MG EC tablet Take 325 mg by mouth 3 (three) times daily with meals.   prenatal vitamin w/FE, FA 27-1 MG Tabs tablet Take 1 tablet by mouth daily at 12 noon.   sertraline 100 MG tablet Commonly known as: ZOLOFT Take 1 tablet (100 mg total) by mouth daily.       , , NP 09/27/2019 7:48 PM  

## 2019-09-27 NOTE — MAU Note (Signed)
Pt is G2P1 at 24w  having pressure and pain in lower left back. Is having trouble getting up and down. Also  started feeling nausea today, but not vomiting. No contractions, LOF or VB. +Fm

## 2019-09-27 NOTE — Discharge Instructions (Signed)
° °PREGNANCY SUPPORT BELT: °You are not alone, Seventy-five percent of women have some sort of abdominal or back pain at some point in their pregnancy. Your baby is growing at a fast pace, which means that your whole body is rapidly trying to adjust to the changes. As your uterus grows, your back may start feeling a bit under stress and this can result in back or abdominal pain that can go from mild, and therefore bearable, to severe pains that will not allow you to sit or lay down comfortably, When it comes to dealing with pregnancy-related pains and cramps, some pregnant women usually prefer natural remedies, which the market is filled with nowadays. For example, wearing a pregnancy support belt can help ease and lessen your discomfort and pain. °WHAT ARE THE BENEFITS OF WEARING A PREGNANCY SUPPORT BELT? A pregnancy support belt provides support to the lower portion of the belly taking some of the weight of the growing uterus and distributing to the other parts of your body. It is designed make you comfortable and gives you extra support. Over the years, the pregnancy apparel market has been studying the needs and wants of pregnant women and they have come up with the most comfortable pregnancy support belts that woman could ever ask for. In fact, you will no longer have to wear a stretched-out or bulky pregnancy belt that is visible underneath your clothes and makes you feel even more uncomfortable. Nowadays, a pregnancy support belt is made of comfortable and stretchy materials that will not irritate your skin but will actually make you feel at ease and you will not even notice you are wearing it. They are easy to put on and adjust during the day and can be worn at night for additional support.  °BENEFITS: °• Relives Back pain °• Relieves Abdominal Muscle and Leg Pain °• Stabilizes the Pelvic Ring °• Offers a Cushioned Abdominal Lift Pad °• Relieves pressure on the Sciatic Nerve Within Minutes °WHERE TO GET  YOUR PREGNANCY BELT: [facility] [phone] @[address] [place], [place] [address] ° ° ° °Back Pain in Pregnancy °Back pain during pregnancy is common. Back pain may be caused by several factors that are related to changes during your pregnancy. °Follow these instructions at home: °Managing pain, stiffness, and swelling ° °  ° °· If directed, for sudden (acute) back pain, put ice on the painful area. °? Put ice in a plastic bag. °? Place a towel between your skin and the bag. °? Leave the ice on for 20 minutes, 2-3 times per day. °· If directed, apply heat to the affected area before you exercise. Use the heat source that your health care provider recommends, such as a moist heat pack or a heating pad. °? Place a towel between your skin and the heat source. °? Leave the heat on for 20-30 minutes. °? Remove the heat if your skin turns bright red. This is especially important if you are unable to feel pain, heat, or cold. You may have a greater risk of getting burned. °· If directed, massage the affected area. °Activity °· Exercise as told by your health care provider. Gentle exercise is the best way to prevent or manage back pain. °· Listen to your body when lifting. If lifting hurts, ask for help or bend your knees. This uses your leg muscles instead of your back muscles. °· Squat down when picking up something from the floor. Do not bend over. °· Only use   bed rest for short periods as told by your health care provider. Bed rest should only be used for the most severe episodes of back pain. °Standing, sitting, and lying down °· Do not stand in one place for long periods of time. °· Use good posture when sitting. Make sure your head rests over your shoulders and is not hanging forward. Use a pillow on your lower back if necessary. °· Try sleeping on your side, preferably the left side, with a pregnancy support pillow or 1-2 regular pillows between your legs. °? If you have back pain after a  night's rest, your bed may be too soft. °? A firm mattress may provide more support for your back during pregnancy. °General instructions °· Do not wear high heels. °· Eat a healthy diet. Try to gain weight within your health care provider's recommendations. °· Use a maternity girdle, elastic sling, or back brace as told by your health care provider. °· Take over-the-counter and prescription medicines only as told by your health care provider. °· Work with a physical therapist or massage therapist to find ways to manage back pain. Acupuncture or massage therapy may be helpful. °· Keep all follow-up visits as told by your health care provider. This is important. °Contact a health care provider if: °· Your back pain interferes with your daily activities. °· You have increasing pain in other parts of your body. °Get help right away if: °· You develop numbness, tingling, weakness, or problems with the use of your arms or legs. °· You develop severe back pain that is not controlled with medicine. °· You have a change in bowel or bladder control. °· You develop shortness of breath, dizziness, or you faint. °· You develop nausea, vomiting, or sweating. °· You have back pain that is a rhythmic, cramping pain similar to labor pains. Labor pain is usually 1-2 minutes apart, lasts for about 1 minute, and involves a bearing down feeling or pressure in your pelvis. °· You have back pain and your water breaks or you have vaginal bleeding. °· You have back pain or numbness that travels down your leg. °· Your back pain developed after you fell. °· You develop pain on one side of your back. °· You see blood in your urine. °· You develop skin blisters in the area of your back pain. °Summary °· Back pain may be caused by several factors that are related to changes during your pregnancy. °· Follow instructions as told by your health care provider for managing pain, stiffness, and swelling. °· Exercise as told by your health care  provider. Gentle exercise is the best way to prevent or manage back pain. °· Take over-the-counter and prescription medicines only as told by your health care provider. °· Keep all follow-up visits as told by your health care provider. This is important. °This information is not intended to replace advice given to you by your health care provider. Make sure you discuss any questions you have with your health care provider. °Document Released: 01/08/2006 Document Revised: 01/19/2019 Document Reviewed: 03/18/2018 °Elsevier Patient Education © 2020 [employer] ° °

## 2019-09-28 LAB — CULTURE, OB URINE: Culture: NO GROWTH

## 2019-10-12 ENCOUNTER — Other Ambulatory Visit: Payer: Self-pay

## 2019-10-12 ENCOUNTER — Ambulatory Visit (INDEPENDENT_AMBULATORY_CARE_PROVIDER_SITE_OTHER): Payer: Managed Care, Other (non HMO) | Admitting: Obstetrics & Gynecology

## 2019-10-12 VITALS — BP 111/68 | HR 85 | Wt 170.0 lb

## 2019-10-12 DIAGNOSIS — Z3480 Encounter for supervision of other normal pregnancy, unspecified trimester: Secondary | ICD-10-CM

## 2019-10-12 DIAGNOSIS — Z23 Encounter for immunization: Secondary | ICD-10-CM

## 2019-10-12 DIAGNOSIS — Z3482 Encounter for supervision of other normal pregnancy, second trimester: Secondary | ICD-10-CM

## 2019-10-12 DIAGNOSIS — Z3A26 26 weeks gestation of pregnancy: Secondary | ICD-10-CM

## 2019-10-12 NOTE — Progress Notes (Signed)
   PRENATAL VISIT NOTE  Subjective:  Leah Hahn is a 28 y.o. G2P1001 at [redacted]w[redacted]d being seen today for ongoing prenatal care.  She is currently monitored for the following issues for this low-risk pregnancy and has Supervision of other normal pregnancy, antepartum; Migraine without aura; Anxiety; Insomnia; Galactorrhea; and Pregnancy headache, antepartum on their problem list.  Patient reports no complaints.  Contractions: Not present. Vag. Bleeding: None.  Movement: Present. Denies leaking of fluid.   The following portions of the patient's history were reviewed and updated as appropriate: allergies, current medications, past family history, past medical history, past social history, past surgical history and problem list.   Objective:   Vitals:   10/12/19 0820  BP: 111/68  Pulse: 85  Weight: 170 lb (77.1 kg)    Fetal Status: Fetal Heart Rate (bpm): 154   Movement: Present     General:  Alert, oriented and cooperative. Patient is in no acute distress.  Skin: Skin is warm and dry. No rash noted.   Cardiovascular: Normal heart rate noted  Respiratory: Normal respiratory effort, no problems with respiration noted  Abdomen: Soft, gravid, appropriate for gestational age.  Pain/Pressure: Present     Pelvic: Cervical exam deferred        Extremities: Normal range of motion.  Edema: None  Mental Status: Normal mood and affect. Normal behavior. Normal judgment and thought content.   Assessment and Plan:  Pregnancy: G2P1001 at [redacted]w[redacted]d 1. Supervision of other normal pregnancy, antepartum - flu and TDAP today - 2 Hour GTT - CBC - RPR - HIV antibody  Preterm labor symptoms and general obstetric precautions including but not limited to vaginal bleeding, contractions, leaking of fluid and fetal movement were reviewed in detail with the patient. Please refer to After Visit Summary for other counseling recommendations.   Return in about 3 weeks (around 11/02/2019) for  virtual.  Future Appointments  Date Time Provider [place]  10/12/2019 10:15 AM Emily Filbert, MD [facility]    Emily Filbert, MD

## 2019-10-13 LAB — HIV ANTIBODY (ROUTINE TESTING W REFLEX): HIV Screen 4th Generation wRfx: NONREACTIVE

## 2019-10-13 LAB — GLUCOSE TOLERANCE, 2 HOURS W/ 1HR
Glucose, 1 hour: 97 mg/dL (ref 65–179)
Glucose, 2 hour: 71 mg/dL (ref 65–152)
Glucose, Fasting: 76 mg/dL (ref 65–91)

## 2019-10-13 LAB — CBC
Hematocrit: 32.9 % — ABNORMAL LOW (ref 34.0–46.6)
Hemoglobin: 11.2 g/dL (ref 11.1–15.9)
MCH: 29.9 pg (ref 26.6–33.0)
MCHC: 34 g/dL (ref 31.5–35.7)
MCV: 88 fL (ref 79–97)
Platelets: 281 10*3/uL (ref 150–450)
RBC: 3.75 x10E6/uL — ABNORMAL LOW (ref 3.77–5.28)
RDW: 12.1 % (ref 11.7–15.4)
WBC: 5.9 10*3/uL (ref 3.4–10.8)

## 2019-10-13 LAB — RPR: RPR Ser Ql: NONREACTIVE

## 2019-10-15 NOTE — L&D Delivery Note (Signed)
Leah Hahn is a 29 y.o. female G2P1001 with IUP at [redacted]w[redacted]d admitted for active labor.  She progressed without augmentation to complete and pushed 1 time to deliver.  Cord clamping delayed by several minutes then clamped by CNM and cut by FOB.    Delivery Note At 8:38 AM a viable female was delivered via  (Presentation: ROA, en caul).  APGAR: 7, 8; weight pending.   Placenta status: spontaneous, intact.  Cord: 3 vessels  Anesthesia:  none Episiotomy:  n/a Lacerations:  none Suture Repair: n/a Est. Blood Loss (mL):  100  Mom to postpartum.  Baby to Couplet care / Skin to Skin.  Rolm Bookbinder CNM 12/26/2019, 8:53 AM

## 2019-11-02 ENCOUNTER — Other Ambulatory Visit: Payer: Self-pay

## 2019-11-02 ENCOUNTER — Telehealth (INDEPENDENT_AMBULATORY_CARE_PROVIDER_SITE_OTHER): Payer: Managed Care, Other (non HMO) | Admitting: Obstetrics and Gynecology

## 2019-11-02 VITALS — BP 100/70 | Wt 170.0 lb

## 2019-11-02 DIAGNOSIS — Z3A29 29 weeks gestation of pregnancy: Secondary | ICD-10-CM

## 2019-11-02 DIAGNOSIS — N643 Galactorrhea not associated with childbirth: Secondary | ICD-10-CM

## 2019-11-02 DIAGNOSIS — O26893 Other specified pregnancy related conditions, third trimester: Secondary | ICD-10-CM

## 2019-11-02 DIAGNOSIS — Z3480 Encounter for supervision of other normal pregnancy, unspecified trimester: Secondary | ICD-10-CM

## 2019-11-02 NOTE — Progress Notes (Signed)
   TELEHEALTH VIRTUAL OBSTETRICS VISIT ENCOUNTER NOTE  [facility]: [facility]  I connected with Leah Hahn on 11/02/19 at  9:30 AM EST by telephone at home and verified that I am speaking with the correct person using two identifiers.   I discussed the limitations, risks, security and privacy concerns of performing an evaluation and management service by telephone and the availability of in person appointments. I also discussed with the patient that there may be a patient responsible charge related to this service. The patient expressed understanding and agreed to proceed.  Subjective:  Leah Hahn is a 29 y.o. G2P1001 at [redacted]w[redacted]d being followed for ongoing prenatal care.  She is currently monitored for the following issues for this low-risk pregnancy and has Supervision of other normal pregnancy, antepartum; Migraine without aura; Anxiety; Insomnia; Galactorrhea; and Pregnancy headache, antepartum on their problem list.  Patient reports B-H contractions. ?rash in sternal area and b/l outer chest/breast area.  Reports fetal movement. Denies any contractions, bleeding or leaking of fluid.   The following portions of the patient's history were reviewed and updated as appropriate: allergies, current medications, past family history, past medical history, past social history, past surgical history and problem list.   Objective:   Vitals:   11/02/19 0947  BP: 100/70  Weight: 170 lb (77.1 kg)    Babyscripts Data Reviewed: yes  General:  Alert, oriented and cooperative.   Mental Status: Normal mood and affect perceived. Normal judgment and thought content.  Rest of physical exam deferred due to type of encounter  Assessment and Plan:  Pregnancy: G2P1001 at [redacted]w[redacted]d 1. Supervision of other normal pregnancy, antepartum Routine care. Normal 28wk labs. Recommend keeping chest area, dry, cotton undergarments and trying an antifungal to see if that  helps Prefers in person visits  2. Galactorrhea Bilateral, looks normal.   Preterm labor symptoms and general obstetric precautions including but not limited to vaginal bleeding, contractions, leaking of fluid and fetal movement were reviewed in detail with the patient.  I discussed the assessment and treatment plan with the patient. The patient was provided an opportunity to ask questions and all were answered. The patient agreed with the plan and demonstrated an understanding of the instructions. The patient was advised to call back or seek an in-person office evaluation/go to MAU at [facility] for any urgent or concerning symptoms. Please refer to After Visit Summary for other counseling recommendations.   I provided 7 minutes of non-face-to-face time during this encounter. The visit was conducted via MyChart-medicine  No follow-ups on file.  Future Appointments  Date Time Provider Department Center  11/16/2019  8:15 AM Reva Bores, MD [facility]    McArthur Bing, MD Center for [facility], [facility]

## 2019-11-09 ENCOUNTER — Telehealth: Payer: Self-pay | Admitting: *Deleted

## 2019-11-09 NOTE — Telephone Encounter (Signed)
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 Pt states she has been eating, and hydrating and does feel baby move. Denies any vaginal bleeding and has not started any new medications. Informed pt that if she continues to feel worse this evening to go to MAU and if not she will call me in the morning with her BP.

## 2019-11-10 ENCOUNTER — Telehealth: Payer: Self-pay | Admitting: *Deleted

## 2019-11-10 NOTE — Telephone Encounter (Signed)
Pt called to let me know her BP's this AM. They were 92/68 and 110/78, pt states she is feeling better. Pt will let us know if they drop again or get feeling symptomatic. Pt has appointment Monday with Dr Vergie Living and will discuss this issue with him, and she will call us if anything changes prior to that appt.

## 2019-11-10 NOTE — Telephone Encounter (Signed)
erroneous

## 2019-11-15 ENCOUNTER — Telehealth (INDEPENDENT_AMBULATORY_CARE_PROVIDER_SITE_OTHER): Payer: Managed Care, Other (non HMO) | Admitting: Obstetrics and Gynecology

## 2019-11-15 ENCOUNTER — Other Ambulatory Visit: Payer: Self-pay

## 2019-11-15 VITALS — BP 98/68 | Wt 171.0 lb

## 2019-11-15 DIAGNOSIS — Z3A31 31 weeks gestation of pregnancy: Secondary | ICD-10-CM

## 2019-11-15 DIAGNOSIS — R42 Dizziness and giddiness: Secondary | ICD-10-CM

## 2019-11-15 DIAGNOSIS — Z3480 Encounter for supervision of other normal pregnancy, unspecified trimester: Secondary | ICD-10-CM

## 2019-11-15 NOTE — Progress Notes (Signed)
   TELEHEALTH VIRTUAL OBSTETRICS VISIT ENCOUNTER NOTE  Clinic: [facility]  I connected with Leah Hahn on 11/15/19 at  2:15 PM EST by telephone at home and verified that I am speaking with the correct person using two identifiers.   I discussed the limitations, risks, security and privacy concerns of performing an evaluation and management service by telephone and the availability of in person appointments. I also discussed with the patient that there may be a patient responsible charge related to this service. The patient expressed understanding and agreed to proceed.  Subjective:  Leah Hahn is a 29 y.o. G2P1001 at [redacted]w[redacted]d being followed for ongoing prenatal care.  She is currently monitored for the following issues for this low-risk pregnancy and has Supervision of other normal pregnancy, antepartum; Migraine without aura; Anxiety; Insomnia; Galactorrhea; and Pregnancy headache, antepartum on their problem list.  Patient reports no current complaints. Reports fetal movement. Denies any contractions, bleeding or leaking of fluid.   The following portions of the patient's history were reviewed and updated as appropriate: allergies, current medications, past family history, past medical history, past social history, past surgical history and problem list.   Objective:   Vitals:   11/15/19 1415  BP: 98/68  Weight: 171 lb (77.6 kg)    Babyscripts Data Reviewed: yes  General:  Alert, oriented and cooperative.   Mental Status: Normal mood and affect perceived. Normal judgment and thought content.  Rest of physical exam deferred due to type of encounter  Assessment and Plan:  Pregnancy: G2P1001 at [redacted]w[redacted]d 1. Supervision of other normal pregnancy, antepartum Routine care.   2. Dizziness Recommend increase PO hydration, salt intake and belly binder. Normal CBC at 28wk labs. If becomes recurrent pt to let us know  Preterm  labor symptoms and general obstetric precautions including but not limited to vaginal bleeding, contractions, leaking of fluid and fetal movement were reviewed in detail with the patient.  I discussed the assessment and treatment plan with the patient. The patient was provided an opportunity to ask questions and all were answered. The patient agreed with the plan and demonstrated an understanding of the instructions. The patient was advised to call back or seek an in-person office evaluation/go to MAU at [facility] for any urgent or concerning symptoms. Please refer to After Visit Summary for other counseling recommendations.   I provided 10 minutes of non-face-to-face time during this encounter. The visit was conducted via MyChart-medicine  Return in about 2 weeks (around 11/29/2019) for low risk, virtual visit  No future appointments.  Drayton Bing, MD Center for [employer], [facility]

## 2019-11-15 NOTE — Progress Notes (Signed)
I connected with  Leah Hahn on 11/15/19 at  2:15 PM EST by telephone and verified that I am speaking with the correct person using two identifiers.   I discussed the limitations, risks, security and privacy concerns of performing an evaluation and management service by telephone and the availability of in person appointments. I also discussed with the patient that there may be a patient responsible charge related to this service. The patient expressed understanding and agreed to proceed.  Scheryl Marten, RN 11/15/2019  2:16 PM

## 2019-11-16 ENCOUNTER — Telehealth: Payer: Managed Care, Other (non HMO) | Admitting: Family Medicine

## 2019-11-29 ENCOUNTER — Telehealth (INDEPENDENT_AMBULATORY_CARE_PROVIDER_SITE_OTHER): Payer: Managed Care, Other (non HMO) | Admitting: Family Medicine

## 2019-11-29 ENCOUNTER — Encounter: Payer: Self-pay | Admitting: Family Medicine

## 2019-11-29 ENCOUNTER — Other Ambulatory Visit: Payer: Self-pay

## 2019-11-29 VITALS — BP 116/68

## 2019-11-29 DIAGNOSIS — Z3A33 33 weeks gestation of pregnancy: Secondary | ICD-10-CM

## 2019-11-29 DIAGNOSIS — Z3483 Encounter for supervision of other normal pregnancy, third trimester: Secondary | ICD-10-CM

## 2019-11-29 DIAGNOSIS — Z3480 Encounter for supervision of other normal pregnancy, unspecified trimester: Secondary | ICD-10-CM

## 2019-11-29 NOTE — Patient Instructions (Signed)

## 2019-11-29 NOTE — Progress Notes (Signed)
Having a lot of "braxton hicks" contractions   I connected with  Akila Batta O'Ferrell on 11/29/19 at  1:45 PM EST by telephone and verified that I am speaking with the correct person using two identifiers.   I discussed the limitations, risks, security and privacy concerns of performing an evaluation and management service by telephone and the availability of in person appointments. I also discussed with the patient that there may be a patient responsible charge related to this service. The patient expressed understanding and agreed to proceed.  Scheryl Marten, RN 11/29/2019  1:46 PM

## 2019-11-29 NOTE — Progress Notes (Signed)
   TELEHEALTH OBSTETRICS PRENATAL VIRTUAL VIDEO VISIT ENCOUNTER NOTE  Provider location: [facility]   I connected with Ryland Smoots Hahn on 11/29/19 at  1:45 PM EST by MyChart Video Encounter at home and verified that I am speaking with the correct person using two identifiers.   I discussed the limitations, risks, security and privacy concerns of performing an evaluation and management service virtually and the availability of in person appointments. I also discussed with the patient that there may be a patient responsible charge related to this service. The patient expressed understanding and agreed to proceed. Subjective:  Leah Hahn is a 29 y.o. G2P1001 at [redacted]w[redacted]d being seen today for ongoing prenatal care.  She is currently monitored for the following issues for this low-risk pregnancy and has Supervision of other normal pregnancy, antepartum; Migraine without aura; Anxiety; Insomnia; Galactorrhea; and Pregnancy headache, antepartum on their problem list.  Patient reports no complaints.  Contractions: Irregular. Vag. Bleeding: None.  Movement: Present. Denies any leaking of fluid.   The following portions of the patient's history were reviewed and updated as appropriate: allergies, current medications, past family history, past medical history, past social history, past surgical history and problem list.   Objective:   Vitals:   11/29/19 1345  BP: 116/68    Fetal Status:     Movement: Present     General:  Alert, oriented and cooperative. Patient is in no acute distress.  Respiratory: Normal respiratory effort, no problems with respiration noted  Mental Status: Normal mood and affect. Normal behavior. Normal judgment and thought content.  Rest of physical exam deferred due to type of encounter  Imaging: No results found.  Assessment and Plan:  Pregnancy: G2P1001 at [redacted]w[redacted]d 1. Supervision of other normal pregnancy,  antepartum Continue routine prenatal care. Braxton hicks contractions  Preterm labor symptoms and general obstetric precautions including but not limited to vaginal bleeding, contractions, leaking of fluid and fetal movement were reviewed in detail with the patient. I discussed the assessment and treatment plan with the patient. The patient was provided an opportunity to ask questions and all were answered. The patient agreed with the plan and demonstrated an understanding of the instructions. The patient was advised to call back or seek an in-person office evaluation/go to MAU at [facility] for any urgent or concerning symptoms. Please refer to After Visit Summary for other counseling recommendations.   I provided 10 minutes of face-to-face time during this encounter.  Return in 3 weeks (on 12/20/2019) for in person, cultures.  Future Appointments  Date Time Provider Department Center  12/13/2019  3:30 PM Simpson Bing, MD [facility]    Reva Bores, MD Center for [facility], [facility]

## 2019-12-05 ENCOUNTER — Other Ambulatory Visit: Payer: Self-pay | Admitting: Obstetrics and Gynecology

## 2019-12-13 ENCOUNTER — Other Ambulatory Visit: Payer: Self-pay

## 2019-12-13 ENCOUNTER — Ambulatory Visit (INDEPENDENT_AMBULATORY_CARE_PROVIDER_SITE_OTHER): Payer: Managed Care, Other (non HMO) | Admitting: Obstetrics and Gynecology

## 2019-12-13 VITALS — BP 124/83 | HR 92 | Wt 182.0 lb

## 2019-12-13 DIAGNOSIS — O368393 Maternal care for abnormalities of the fetal heart rate or rhythm, unspecified trimester, fetus 3: Secondary | ICD-10-CM | POA: Diagnosis not present

## 2019-12-13 DIAGNOSIS — Z3A35 35 weeks gestation of pregnancy: Secondary | ICD-10-CM | POA: Diagnosis not present

## 2019-12-13 DIAGNOSIS — O368390 Maternal care for abnormalities of the fetal heart rate or rhythm, unspecified trimester, not applicable or unspecified: Secondary | ICD-10-CM

## 2019-12-13 DIAGNOSIS — O479 False labor, unspecified: Secondary | ICD-10-CM

## 2019-12-13 NOTE — Progress Notes (Signed)
Prenatal Visit Note Date: 12/13/2019 Clinic: [facility]  Subjective:  Leah Hahn is a 29 y.o. G2P1001 at [redacted]w[redacted]d being seen today for ongoing prenatal care.  She is currently monitored for the following issues for this low-risk pregnancy and has Supervision of other normal pregnancy, antepartum; Migraine without aura; Anxiety; Insomnia; Galactorrhea; and Pregnancy headache, antepartum on their problem list.  Patient reports no complaints.   Contractions: Irregular. Vag. Bleeding: None.  Movement: Present. Denies leaking of fluid.   The following portions of the patient's history were reviewed and updated as appropriate: allergies, current medications, past family history, past medical history, past social history, past surgical history and problem list. Problem list updated.  Objective:   Vitals:   12/13/19 1528  BP: 124/83  Pulse: 92  Weight: 182 lb (82.6 kg)    Fetal Status: Fetal Heart Rate (bpm): 165 Fundal Height: 35 cm Movement: Present  Presentation: Vertex  General:  Alert, oriented and cooperative. Patient is in no acute distress.  Skin: Skin is warm and dry. No rash noted.   Cardiovascular: Normal heart rate noted  Respiratory: Normal respiratory effort, no problems with respiration noted  Abdomen: Soft, gravid, appropriate for gestational age. Pain/Pressure: Present     Pelvic:  Cervical exam performed Dilation: 1.5 Effacement (%): 50 Station: -2  Extremities: Normal range of motion.  Edema: Trace  Mental Status: Normal mood and affect. Normal behavior. Normal judgment and thought content.   Urinalysis:      Assessment and Plan:  Pregnancy: G2P1001 at [redacted]w[redacted]d  1. Fetal tachycardia affecting management of mother 160s fetal dopplers Reactive NST (140 baseline, +accels, no decel, mod variability, toco +irriability x 34m)   2. Braxton Hick's contraction Not feeling anything on EFM  Preterm labor symptoms and general obstetric  precautions including but not limited to vaginal bleeding, contractions, leaking of fluid and fetal movement were reviewed in detail with the patient. Please refer to After Visit Summary for other counseling recommendations.  Return in about 1 week (around 12/20/2019) for 7-10 rob in person.   [place] Bing, MD

## 2019-12-13 NOTE — Progress Notes (Signed)
Having a lot of uncomfortable contractions over this past weekend

## 2019-12-20 ENCOUNTER — Other Ambulatory Visit: Payer: Self-pay

## 2019-12-20 ENCOUNTER — Ambulatory Visit (INDEPENDENT_AMBULATORY_CARE_PROVIDER_SITE_OTHER): Payer: Managed Care, Other (non HMO) | Admitting: Obstetrics and Gynecology

## 2019-12-20 VITALS — BP 127/84 | HR 91 | Wt 182.0 lb

## 2019-12-20 DIAGNOSIS — Z3480 Encounter for supervision of other normal pregnancy, unspecified trimester: Secondary | ICD-10-CM

## 2019-12-20 DIAGNOSIS — Z113 Encounter for screening for infections with a predominantly sexual mode of transmission: Secondary | ICD-10-CM

## 2019-12-20 DIAGNOSIS — Z3483 Encounter for supervision of other normal pregnancy, third trimester: Secondary | ICD-10-CM

## 2019-12-20 DIAGNOSIS — Z3A36 36 weeks gestation of pregnancy: Secondary | ICD-10-CM

## 2019-12-21 NOTE — Progress Notes (Signed)
Prenatal Visit Note Date: 12/20/2019 Clinic: [facility]  Subjective:  Leah Hahn is a 29 y.o. G2P1001 at [redacted]w[redacted]d being seen today for ongoing prenatal care.  She is currently monitored for the following issues for this low-risk pregnancy and has Supervision of other normal pregnancy, antepartum; Migraine without aura; Anxiety; Insomnia; Galactorrhea; and Pregnancy headache, antepartum on their problem list.  Patient reports no complaints.   Contractions: Irregular. Vag. Bleeding: None.  Movement: Present. Denies leaking of fluid.   The following portions of the patient's history were reviewed and updated as appropriate: allergies, current medications, past family history, past medical history, past social history, past surgical history and problem list. Problem list updated.  Objective:   Vitals:   12/20/19 1526  BP: 127/84  Pulse: 91  Weight: 182 lb (82.6 kg)    Fetal Status: Fetal Heart Rate (bpm): 145 Fundal Height: 36 cm Movement: Present  Presentation: Vertex  General:  Alert, oriented and cooperative. Patient is in no acute distress.  Skin: Skin is warm and dry. No rash noted.   Cardiovascular: Normal heart rate noted  Respiratory: Normal respiratory effort, no problems with respiration noted  Abdomen: Soft, gravid, appropriate for gestational age. Pain/Pressure: Present     Pelvic:  Cervical exam performed Dilation: 2.5 Effacement (%): 50 Station: -1  Extremities: Normal range of motion.  Edema: Trace  Mental Status: Normal mood and affect. Normal behavior. Normal judgment and thought content.   Urinalysis:      Assessment and Plan:  Pregnancy: G2P1001 at [redacted]w[redacted]d  1. Supervision of other normal pregnancy, antepartum Routinecare - Strep Gp B NAA - Cervicovaginal ancillary only( [place])  Preterm labor symptoms and general obstetric precautions including but not limited to vaginal bleeding, contractions, leaking of fluid and fetal  movement were reviewed in detail with the patient. Please refer to After Visit Summary for other counseling recommendations.  Return in about 1 week (around 12/27/2019) for 7-10d , low risk, in person.   Marianna Bing, MD

## 2019-12-22 LAB — STREP GP B NAA: Strep Gp B NAA: NEGATIVE

## 2019-12-22 LAB — CERVICOVAGINAL ANCILLARY ONLY
Chlamydia: NEGATIVE
Comment: NEGATIVE
Comment: NORMAL
Neisseria Gonorrhea: NEGATIVE

## 2019-12-25 ENCOUNTER — Inpatient Hospital Stay (HOSPITAL_COMMUNITY)
Admission: AD | Admit: 2019-12-25 | Discharge: 2019-12-26 | DRG: 805 | Disposition: A | Discharge status: Home / Self Care | Payer: Managed Care, Other (non HMO) | Attending: Obstetrics and Gynecology | Admitting: Obstetrics and Gynecology

## 2019-12-25 ENCOUNTER — Encounter (HOSPITAL_COMMUNITY): Payer: Self-pay | Admitting: Obstetrics and Gynecology

## 2019-12-25 ENCOUNTER — Other Ambulatory Visit: Payer: Self-pay

## 2019-12-25 DIAGNOSIS — O4703 False labor before 37 completed weeks of gestation, third trimester: Secondary | ICD-10-CM | POA: Diagnosis present

## 2019-12-25 DIAGNOSIS — O99344 Other mental disorders complicating childbirth: Secondary | ICD-10-CM | POA: Diagnosis present

## 2019-12-25 DIAGNOSIS — Z20822 Contact with and (suspected) exposure to covid-19: Secondary | ICD-10-CM | POA: Diagnosis present

## 2019-12-25 DIAGNOSIS — F419 Anxiety disorder, unspecified: Secondary | ICD-10-CM | POA: Diagnosis present

## 2019-12-25 DIAGNOSIS — Z3A36 36 weeks gestation of pregnancy: Secondary | ICD-10-CM | POA: Diagnosis not present

## 2019-12-25 LAB — TYPE AND SCREEN
ABO/RH(D): A POS
Antibody Screen: NEGATIVE

## 2019-12-25 LAB — BASIC METABOLIC PANEL
Anion gap: 13 (ref 5–15)
BUN: 10 mg/dL (ref 6–20)
CO2: 20 mmol/L — ABNORMAL LOW (ref 22–32)
Calcium: 8.7 mg/dL — ABNORMAL LOW (ref 8.9–10.3)
Chloride: 102 mmol/L (ref 98–111)
Creatinine, Ser: 0.9 mg/dL (ref 0.44–1.00)
GFR calc Af Amer: >60 mL/min (ref >60)
GFR calc non Af Amer: >60 mL/min (ref >60)
Glucose, Bld: 89 mg/dL (ref 70–99)
Potassium: 4.1 mmol/L (ref 3.5–5.1)
Sodium: 135 mmol/L (ref 135–145)

## 2019-12-25 LAB — URINALYSIS, ROUTINE W REFLEX MICROSCOPIC
Bilirubin Urine: NEGATIVE
Glucose, UA: NEGATIVE mg/dL
Hgb urine dipstick: NEGATIVE
Ketones, ur: 80 mg/dL — AB
Nitrite: NEGATIVE
Protein, ur: 30 mg/dL — AB
Specific Gravity, Urine: 1.021 (ref 1.005–1.030)
pH: 6 (ref 5.0–8.0)

## 2019-12-25 LAB — CBC
HCT: 34.4 % — ABNORMAL LOW (ref 36.0–46.0)
Hemoglobin: 10.8 g/dL — ABNORMAL LOW (ref 12.0–15.0)
MCH: 26.5 pg (ref 26.0–34.0)
MCHC: 31.4 g/dL (ref 30.0–36.0)
MCV: 84.5 fL (ref 80.0–100.0)
Platelets: 307 10*3/uL (ref 150–400)
RBC: 4.07 MIL/uL (ref 3.87–5.11)
RDW: 12.9 % (ref 11.5–15.5)
WBC: 10.2 10*3/uL (ref 4.0–10.5)
nRBC: 0 % (ref 0.0–0.2)

## 2019-12-25 LAB — ABO/RH: ABO/RH(D): A POS

## 2019-12-25 LAB — SARS CORONAVIRUS 2 (TAT 6-24 HRS): SARS Coronavirus 2: NEGATIVE

## 2019-12-25 MED ORDER — OXYCODONE-ACETAMINOPHEN 5-325 MG PO TABS: 1.0000 | ORAL_TABLET | ORAL | Status: DC | PRN

## 2019-12-25 MED ORDER — OXYTOCIN BOLUS FROM INFUSION: 500.0000 mL | Freq: Once | INTRAVENOUS | Status: DC

## 2019-12-25 MED ORDER — LACTATED RINGERS IV SOLN: 500.0000 mL | INTRAVENOUS | Status: DC | PRN

## 2019-12-25 MED ORDER — SERTRALINE HCL 100 MG PO TABS
100.0000 mg | ORAL_TABLET | Freq: Every day | ORAL | Status: DC
  Administered 2019-12-25: 16:00:00 100 mg via ORAL
  Filled 2019-12-25 (×2): qty 1

## 2019-12-25 MED ORDER — LACTATED RINGERS IV SOLN: INTRAVENOUS | Status: DC

## 2019-12-25 MED ORDER — SOD CITRATE-CITRIC ACID 500-334 MG/5ML PO SOLN: 30.0000 mL | ORAL | Status: DC | PRN

## 2019-12-25 MED ORDER — OXYCODONE-ACETAMINOPHEN 5-325 MG PO TABS: 2.0000 | ORAL_TABLET | ORAL | Status: DC | PRN

## 2019-12-25 MED ORDER — LIDOCAINE HCL (PF) 1 % IJ SOLN: 30.0000 mL | INTRAMUSCULAR | Status: DC | PRN

## 2019-12-25 MED ORDER — OXYTOCIN 40 UNITS IN NORMAL SALINE INFUSION - SIMPLE MED: 2.5000 [IU]/h | INTRAVENOUS | Status: DC

## 2019-12-25 MED ORDER — ACETAMINOPHEN 325 MG PO TABS: 650.0000 mg | ORAL_TABLET | ORAL | Status: DC | PRN

## 2019-12-25 MED ORDER — ONDANSETRON HCL 4 MG/2ML IJ SOLN
4.0000 mg | Freq: Four times a day (QID) | INTRAMUSCULAR | Status: DC | PRN
  Administered 2019-12-25: 4 mg via INTRAVENOUS
  Filled 2019-12-25: qty 2

## 2019-12-25 MED ORDER — SODIUM CHLORIDE 0.9 % IV SOLN
8.0000 mg | INTRAVENOUS | Status: AC
  Administered 2019-12-25: 8 mg via INTRAVENOUS
  Filled 2019-12-25: qty 4

## 2019-12-25 MED ORDER — BETAMETHASONE SOD PHOS & ACET 6 (3-3) MG/ML IJ SUSP
12.0000 mg | INTRAMUSCULAR | Status: DC
  Administered 2019-12-25: 16:00:00 12 mg via INTRAMUSCULAR
  Filled 2019-12-25: qty 5

## 2019-12-25 MED ORDER — LACTATED RINGERS IV BOLUS
1000.0000 mL | Freq: Once | INTRAVENOUS | Status: AC
  Administered 2019-12-25: 1000 mL via INTRAVENOUS

## 2019-12-25 NOTE — Progress Notes (Addendum)
Labor Progress Note Leah Hahn is a 29 y.o. G2P1001 at [redacted]w[redacted]d presented for preterm labor  S:  Patient comfortable. Reports contractions are the same.  O:  BP 122/73   Pulse 91   Temp 97.6 F (36.4 C) (Oral)   Resp 20   Ht 5\' 7"  (1.702 m)   Wt 82.6 kg   LMP 03/15/2019 (Approximate)   SpO2 100%   BMI 28.51 kg/m   Fetal Tracing:  Baseline: 160 Variability: moderate Accels: 15x15 Decels: none  Toco: occasional uc's   CVE: Dilation: 5 Effacement (%): 80 Cervical Position: Middle Station: -2 Presentation: Vertex Exam by:: [email or web address] RN    A&P: 29 y.o. G2P1001 101w5d preterm labor #Labor: Cervical exam deferred due to patient comfort. Will continue expectant management. #Pain: n/a #FWB: Cat 1 #GBS negative  [redacted]w[redacted]d, CNM 7:30 PM  Addendum: Will plan to check around 0630 and discharge home if no cervical change.  Rolm Bookbinder, MD [employer] [facility] Fellow, [facility] for [facility]

## 2019-12-25 NOTE — MAU Note (Signed)
Pt reports to mau with c/o vomiting and diarrhea since last night around 11pm.  Pt reports several episodes.  Pt denies lof and reports good fetal movement.  Pt also c/o lower abd pressure and urinary urgency.

## 2019-12-25 NOTE — MAU Provider Note (Addendum)
History     CSN: [id number]  Arrival date and time: 12/25/19 7253  Chief Complaint  Patient presents with  . Abdominal Pain    pressure  . Emesis  . Diarrhea   29 y.o. G2P1001 @30 .5 wks presenting with N/V/D. Sx started last night. She did eat take out but it was only rice and son had the same. Reports 20+ episodes. Reports +FM. Having some BH ctx, not worse than usual. No VB or LOF. Having some urgency but no other urinary sx.   OB History    Gravida  2   Para  1   Term  1   Preterm      AB      Living  1     SAB      TAB      Ectopic      Multiple      Live Births  1           Past Medical History:  Diagnosis Date  . Migraine without aura 03/01/2014    Past Surgical History:  Procedure Laterality Date  . COLONOSCOPY  2016  . KNEE SURGERY Left 1992  . WISDOM TOOTH EXTRACTION      Family History  Problem Relation Age of Onset  . Anemia Mother   . Anemia Maternal Grandfather   . Cancer Maternal Grandfather        COLON/LIVER    Social History   Tobacco Use  . Smoking status: Never Smoker  . Smokeless tobacco: Never Used  Substance Use Topics  . Alcohol use: Not Currently    Alcohol/week: 1.0 - 2.0 standard drinks    Types: 1 - 2 Cans of beer per week    Comment: 3 times week  . Drug use: No    Allergies: No Known Allergies  Medications Prior to Admission  Medication Sig Dispense Refill Last Dose  . Blood Pressure KIT 1 Device by Does not apply route once a week. To be monitored weekly from home 1 kit 0   . Elastic Bandages & Supports (COMFORT FIT MATERNITY SUPP SM) MISC 1 Units by Does not apply route daily as needed. 1 each 0   . ferrous sulfate 325 (65 FE) MG EC tablet Take 325 mg by mouth 3 (three) times daily with meals.     . prenatal vitamin w/FE, FA (PRENATAL 1 + 1) 27-1 MG TABS tablet Take 1 tablet by mouth daily at 12 noon.     . sertraline (ZOLOFT) 100 MG tablet Take 1 tablet (100 mg total) by mouth daily. 90 tablet 2      Review of Systems  Constitutional: Negative for chills and fever.  Gastrointestinal: Positive for diarrhea, nausea and vomiting. Negative for abdominal pain.  Genitourinary: Positive for urgency. Negative for vaginal bleeding and vaginal discharge.   Physical Exam   Blood pressure 131/73, pulse (!) 115, temperature 98.3 F (36.8 C), temperature source Oral, resp. rate 18, last menstrual period 03/15/2019, SpO2 100 %.  Physical Exam  Constitutional: She is oriented to person, place, and time. She appears well-developed and well-nourished. No distress.  HENT:  Head: Normocephalic and atraumatic.  Respiratory: Effort normal. No respiratory distress.  Genitourinary:    Genitourinary Comments: 3.5/60 per RN   Musculoskeletal:        General: Normal range of motion.     Cervical back: Normal range of motion.  Neurological: She is alert and oriented to person, place, and time.  Psychiatric: She has  a normal mood and affect.  EFM: 180 bpm, mod variability, no accels, no decels Toco: irritability  No results found for this or any previous visit (from the past 24 hour(s)).  MAU Course  Procedures Meds ordered this encounter  Medications  . lactated ringers bolus 1,000 mL  . ondansetron (ZOFRAN) 8 mg in sodium chloride 0.9 % 50 mL IVPB   MDM Labs ordered and reviewed. FHR 150s and NST reactive after IVF. Pt feeling better. N/V resolved. Tolerating po fluids. Pt c/o worsening ctx and pressure. Cervix recheck and now 5/80/-2. Suspect early labor. Plan for admit and BMZ.  Assessment and Plan  [redacted] weeks gestation Threatened PTL Admit to LD Mngt per labor team- notified  Julianne Handler, CNM 12/25/2019, 10:52 AM

## 2019-12-25 NOTE — H&P (Signed)
OBSTETRIC ADMISSION HISTORY AND PHYSICAL  Leah Hahn is a 29 y.o. female G2P1001 with IUP at 12w5dby 8 week u/s presenting for preterm laboe. She reports +FMs, No LOF, no VB, no blurry vision, headaches or peripheral edema, and RUQ pain.  She plans on breast feeding. She request OCPs for birth control. [facility]  Dating: By 8 week u/s --->  Estimated Date of Delivery: 01/17/20   Nursing Staff Provider  Office Location  [place] Dating   edc 4/5, 8wk u/s  Language   English Anatomy UKorea Limited at 19w  Flu Vaccine  10/12/19 Genetic Screen  NIPS: Low risk female  AFP:      TDaP vaccine   10/12/19 Hgb A1C or  GTT Early: n/a Third trimester 76/97/71  Rhogam   n/a   LAB RESULTS   Feeding Plan  breast Blood Type A/Positive/-- (09/04 0908) A pos  Contraception  OCPs Antibody Negative (09/04 0908)neg  Circumcision  inpatient Rubella 1.31 (09/04 0908)imm  Pediatrician   Kernoddle RPR Non Reactive (09/04 0908) neg  Support Person  husband AElberta FortisHBsAg Negative (09/04 0908) neg  Prenatal Classes  online prn HIV Non Reactive (09/04 0908)neg  BTL Consent  n/a GBS   neg  VBAC Consent  n/a Pap  neg 2018    Hgb Electro  AA  BP Cuff  yes CF neg    SMA neg    Waterbirth  [ ]  Class [ ]  Consent [ ]  CNM visit    Prenatal History/Complications:  Past Medical History: Past Medical History:  Diagnosis Date  . Migraine without aura 03/01/2014    Past Surgical History: Past Surgical History:  Procedure Laterality Date  . COLONOSCOPY  2016  . KNEE SURGERY Left 1992  . WISDOM TOOTH EXTRACTION      Obstetrical History: OB History    Gravida  2   Para  1   Term  1   Preterm      AB      Living  1     SAB      TAB      Ectopic      Multiple      Live Births  1           Social History Social History   Socioeconomic History  . Marital status: Married    Spouse name: Not on file  . Number of children: Not on file  . Years of  education: Not on file  . Highest education level: Not on file  Occupational History  . Not on file  Tobacco Use  . Smoking status: Never Smoker  . Smokeless tobacco: Never Used  Substance and Sexual Activity  . Alcohol use: Not Currently    Alcohol/week: 1.0 - 2.0 standard drinks    Types: 1 - 2 Cans of beer per week    Comment: 3 times week  . Drug use: No  . Sexual activity: Yes    Partners: Male    Birth control/protection: None  Other Topics Concern  . Not on file  Social History Narrative  . Not on file   Social Determinants of Health   Financial Resource Strain:   . Difficulty of Paying Living Expenses:   Food Insecurity:   . Worried About RCharity fundraiserin the Last Year:   . RArboriculturistin the Last Year:   Transportation Needs:   . Lack of Transportation (  Medical):   Marland Kitchen Lack of Transportation (Non-Medical):   Physical Activity:   . Days of Exercise per Week:   . Minutes of Exercise per Session:   Stress:   . Feeling of Stress :   Social Connections:   . Frequency of Communication with Friends and Family:   . Frequency of Social Gatherings with Friends and Family:   . Attends Religious Services:   . Active Member of Clubs or Organizations:   . Attends Archivist Meetings:   Marland Kitchen Marital Status:     Family History: Family History  Problem Relation Age of Onset  . Anemia Mother   . Anemia Maternal Grandfather   . Cancer Maternal Grandfather        COLON/LIVER    Allergies: No Known Allergies  Medications Prior to Admission  Medication Sig Dispense Refill Last Dose  . Blood Pressure KIT 1 Device by Does not apply route once a week. To be monitored weekly from home 1 kit 0   . Elastic Bandages & Supports (COMFORT FIT MATERNITY SUPP SM) MISC 1 Units by Does not apply route daily as needed. 1 each 0   . ferrous sulfate 325 (65 FE) MG EC tablet Take 325 mg by mouth 3 (three) times daily with meals.     . prenatal vitamin w/FE, FA (PRENATAL  1 + 1) 27-1 MG TABS tablet Take 1 tablet by mouth daily at 12 noon.     . sertraline (ZOLOFT) 100 MG tablet Take 1 tablet (100 mg total) by mouth daily. 90 tablet 2      Review of Systems   All systems reviewed and negative except as stated in HPI  Blood pressure 131/73, pulse (!) 115, temperature 98.3 F (36.8 C), temperature source Oral, resp. rate 18, last menstrual period 03/15/2019, SpO2 100 %. General appearance: alert, cooperative and no distress Lungs: clear to auscultation bilaterally Heart: regular rate and rhythm Abdomen: soft, non-tender; bowel sounds normal Pelvic: n/a Extremities: Homans sign is negative, no sign of DVT DTR's +2 Presentation: cephalic Fetal monitoringBaseline: 150 bpm, Variability: Good {> 6 bpm), Accelerations: Reactive and Decelerations: Absent Uterine activityFrequency: Every 5-7 minutes Dilation: 3.5 Effacement (%): 60, 70 Exam by:: Fredda Hammed RN   Prenatal labs: ABO, Rh: A/Positive/-- (09/04 0908) Antibody: Negative (09/04 0908) Rubella: 1.31 (09/04 0908) RPR: Non Reactive (12/29 0813)  HBsAg: Negative (09/04 0908)  HIV: Non Reactive (12/29 0813)  GBS: --Henderson Cloud (03/08 1559)   Prenatal Transfer Tool  Maternal Diabetes: No Genetic Screening: Normal Maternal Ultrasounds/Referrals: Normal Fetal Ultrasounds or other Referrals:  None Maternal Substance Abuse:  No Significant Maternal Medications:  None Significant Maternal Lab Results: Group B Strep negative  Results for orders placed or performed during the hospital encounter of 12/25/19 (from the past 24 hour(s))  CBC   Collection Time: 12/25/19 11:05 AM  Result Value Ref Range   WBC 10.2 4.0 - 10.5 K/uL   RBC 4.07 3.87 - 5.11 MIL/uL   Hemoglobin 10.8 (L) 12.0 - 15.0 g/dL   HCT 34.4 (L) 36.0 - 46.0 %   MCV 84.5 80.0 - 100.0 fL   MCH 26.5 26.0 - 34.0 pg   MCHC 31.4 30.0 - 36.0 g/dL   RDW 12.9 11.5 - 15.5 %   Platelets 307 150 - 400 K/uL   nRBC 0.0 0.0 - 0.2 %  Basic  metabolic panel   Collection Time: 12/25/19 11:05 AM  Result Value Ref Range   Sodium 135 135 - 145 mmol/L  Potassium 4.1 3.5 - 5.1 mmol/L   Chloride 102 98 - 111 mmol/L   CO2 20 (L) 22 - 32 mmol/L   Glucose, Bld 89 70 - 99 mg/dL   BUN 10 6 - 20 mg/dL   Creatinine, Ser 0.90 0.44 - 1.00 mg/dL   Calcium 8.7 (L) 8.9 - 10.3 mg/dL   GFR calc non Af Amer >60 >60 mL/min   GFR calc Af Amer >60 >60 mL/min   Anion gap 13 5 - 15  Urinalysis, Routine w reflex microscopic   Collection Time: 12/25/19 12:45 PM  Result Value Ref Range   Color, Urine YELLOW YELLOW   APPearance HAZY (A) CLEAR   Specific Gravity, Urine 1.021 1.005 - 1.030   pH 6.0 5.0 - 8.0   Glucose, UA NEGATIVE NEGATIVE mg/dL   Hgb urine dipstick NEGATIVE NEGATIVE   Bilirubin Urine NEGATIVE NEGATIVE   Ketones, ur 80 (A) NEGATIVE mg/dL   Protein, ur 30 (A) NEGATIVE mg/dL   Nitrite NEGATIVE NEGATIVE   Leukocytes,Ua TRACE (A) NEGATIVE   RBC / HPF 0-5 0 - 5 RBC/hpf   WBC, UA 0-5 0 - 5 WBC/hpf   Bacteria, UA RARE (A) NONE SEEN   Squamous Epithelial / LPF 0-5 0 - 5   Mucus PRESENT     Patient Active Problem List   Diagnosis Date Noted  . Pregnancy headache, antepartum 06/08/2019  . Galactorrhea 06/30/2017  . Migraine without aura 03/01/2014  . Anxiety 03/01/2014  . Insomnia 03/01/2014  . Supervision of other normal pregnancy, antepartum 12/28/2013    Assessment/Plan:  Kamera Dubas Hahn is a 29 y.o. G2P1001 at 44w5dhere for preterm labor  #Labor: expectant management at this time. Discussed with patient that we will not augment her and possibility of going home if no change. Also reviewed current high census NICU status and possibility of transfer of infant if it needs NICU care. Patient and FOB verbalized understanding.  #Pain: Per patient request #FWB: Cat 1 #ID:  GBS neg #MOF: Breast #MOC: OSimpson CNM  12/25/2019, 2:14 PM

## 2019-12-25 NOTE — Progress Notes (Signed)
Eating pizza

## 2019-12-26 ENCOUNTER — Encounter (HOSPITAL_COMMUNITY): Payer: Self-pay | Admitting: Obstetrics and Gynecology

## 2019-12-26 ENCOUNTER — Inpatient Hospital Stay (HOSPITAL_COMMUNITY)
Admission: AD | Admit: 2019-12-26 | Discharge: 2019-12-28 | Disposition: A | Discharge status: Home / Self Care | Payer: Managed Care, Other (non HMO) | Source: Home / Self Care | Attending: Obstetrics and Gynecology | Admitting: Obstetrics and Gynecology

## 2019-12-26 DIAGNOSIS — Z3A36 36 weeks gestation of pregnancy: Secondary | ICD-10-CM

## 2019-12-26 DIAGNOSIS — F419 Anxiety disorder, unspecified: Secondary | ICD-10-CM | POA: Diagnosis present

## 2019-12-26 LAB — RPR: RPR Ser Ql: NONREACTIVE

## 2019-12-26 MED ORDER — PRENATAL MULTIVITAMIN CH
1.0000 | ORAL_TABLET | Freq: Every day | ORAL | Status: DC
  Administered 2019-12-26 – 2019-12-28 (×3): 1 via ORAL
  Filled 2019-12-26 (×3): qty 1

## 2019-12-26 MED ORDER — ONDANSETRON HCL 4 MG/2ML IJ SOLN: 4.0000 mg | INTRAMUSCULAR | Status: DC | PRN

## 2019-12-26 MED ORDER — COCONUT OIL OIL: 1.0000 "application " | TOPICAL_OIL | Status: DC | PRN

## 2019-12-26 MED ORDER — ZOLPIDEM TARTRATE 5 MG PO TABS: 5.0000 mg | ORAL_TABLET | Freq: Every evening | ORAL | Status: DC | PRN

## 2019-12-26 MED ORDER — OXYTOCIN 10 UNIT/ML IJ SOLN
10.0000 [IU] | Freq: Once | INTRAMUSCULAR | Status: DC
  Administered 2019-12-26: 10 [IU] via INTRAMUSCULAR

## 2019-12-26 MED ORDER — ACETAMINOPHEN 325 MG PO TABS
650.0000 mg | ORAL_TABLET | ORAL | Status: DC | PRN
  Administered 2019-12-26: 650 mg via ORAL
  Filled 2019-12-26: qty 2

## 2019-12-26 MED ORDER — IBUPROFEN 600 MG PO TABS
600.0000 mg | ORAL_TABLET | Freq: Four times a day (QID) | ORAL | Status: DC
  Administered 2019-12-26 – 2019-12-28 (×9): 600 mg via ORAL
  Filled 2019-12-26 (×9): qty 1

## 2019-12-26 MED ORDER — OXYCODONE HCL 5 MG PO TABS: 5.0000 mg | ORAL_TABLET | ORAL | 0 refills | Status: DC | PRN

## 2019-12-26 MED ORDER — ONDANSETRON HCL 4 MG PO TABS: 4.0000 mg | ORAL_TABLET | ORAL | Status: DC | PRN

## 2019-12-26 MED ORDER — FENTANYL CITRATE (PF) 100 MCG/2ML IJ SOLN
INTRAMUSCULAR | Status: AC
  Filled 2019-12-26: qty 2

## 2019-12-26 MED ORDER — FENTANYL CITRATE (PF) 100 MCG/2ML IJ SOLN: 50.0000 ug | Freq: Once | INTRAMUSCULAR | Status: DC

## 2019-12-26 MED ORDER — DIPHENHYDRAMINE HCL 25 MG PO CAPS: 25.0000 mg | ORAL_CAPSULE | Freq: Four times a day (QID) | ORAL | Status: DC | PRN

## 2019-12-26 MED ORDER — DIBUCAINE (PERIANAL) 1 % EX OINT: 1.0000 "application " | TOPICAL_OINTMENT | CUTANEOUS | Status: DC | PRN

## 2019-12-26 MED ORDER — WITCH HAZEL-GLYCERIN EX PADS: 1.0000 "application " | MEDICATED_PAD | CUTANEOUS | Status: DC | PRN

## 2019-12-26 MED ORDER — SENNOSIDES-DOCUSATE SODIUM 8.6-50 MG PO TABS
2.0000 | ORAL_TABLET | ORAL | Status: DC
  Administered 2019-12-26 – 2019-12-27 (×2): 2 via ORAL
  Filled 2019-12-26 (×2): qty 2

## 2019-12-26 MED ORDER — BENZOCAINE-MENTHOL 20-0.5 % EX AERO
1.0000 "application " | INHALATION_SPRAY | CUTANEOUS | Status: DC | PRN
  Administered 2019-12-26: 1 via TOPICAL
  Filled 2019-12-26: qty 56

## 2019-12-26 MED ORDER — TETANUS-DIPHTH-ACELL PERTUSSIS 5-2.5-18.5 LF-MCG/0.5 IM SUSP: 0.5000 mL | Freq: Once | INTRAMUSCULAR | Status: DC

## 2019-12-26 MED ORDER — SIMETHICONE 80 MG PO CHEW: 80.0000 mg | CHEWABLE_TABLET | ORAL | Status: DC | PRN

## 2019-12-26 NOTE — H&P (Signed)
OBSTETRIC ADMISSION HISTORY AND PHYSICAL  Leah Hahn is a 29 y.o. female G2P1001 with IUP at 14w6dby LMP presenting for active labor. She reports +FMs, No LOF, no VB, no blurry vision, headaches or peripheral edema, and RUQ pain.  She plans on breast feeding. She request POPs for birth control. [facility]  Dating: By LMP --->  Estimated Date of Delivery: 01/17/20  Nursing Staff Provider  Office Location  [place] Dating   edc 4/5, 8wk u/s  Language   English Anatomy UKorea Limited at 19w  Flu Vaccine  10/12/19 Genetic Screen  NIPS: Low risk female  AFP:      TDaP vaccine   10/12/19 Hgb A1C or  GTT Early: n/a Third trimester 76/97/71  Rhogam   n/a   LAB RESULTS   Feeding Plan  breast Blood Type A/Positive/-- (09/04 0908) A pos  Contraception  OCPs Antibody Negative (09/04 0908)neg  Circumcision  inpatient Rubella 1.31 (09/04 0908)imm  Pediatrician   Kernoddle RPR Non Reactive (09/04 0908) neg  Support Person  husband AElberta FortisHBsAg Negative (09/04 0908) neg  Prenatal Classes  online prn HIV Non Reactive (09/04 0908)neg  BTL Consent  n/a GBS   neg  VBAC Consent  n/a Pap  neg 2018    Hgb Electro  AA  BP Cuff  yes CF neg    SMA neg    Waterbirth  '[ ]'$  Class '[ ]'$  Consent '[ ]'$  CNM visit    Prenatal History/Complications:  Past Medical History: Past Medical History:  Diagnosis Date  . Migraine without aura 03/01/2014    Past Surgical History: Past Surgical History:  Procedure Laterality Date  . COLONOSCOPY  2016  . KNEE SURGERY Left 1992  . WISDOM TOOTH EXTRACTION      Obstetrical History: OB History    Gravida  2   Para  1   Term  1   Preterm      AB      Living  1     SAB      TAB      Ectopic      Multiple      Live Births  1           Social History Social History   Socioeconomic History  . Marital status: Married    Spouse name: Not on file  . Number of children: Not on file  . Years of  education: Not on file  . Highest education level: Not on file  Occupational History  . Not on file  Tobacco Use  . Smoking status: Never Smoker  . Smokeless tobacco: Never Used  Substance and Sexual Activity  . Alcohol use: Not Currently    Alcohol/week: 1.0 - 2.0 standard drinks    Types: 1 - 2 Cans of beer per week    Comment: 3 times week  . Drug use: No  . Sexual activity: Yes    Partners: Male    Birth control/protection: None  Other Topics Concern  . Not on file  Social History Narrative  . Not on file   Social Determinants of Health   Financial Resource Strain:   . Difficulty of Paying Living Expenses:   Food Insecurity:   . Worried About RCharity fundraiserin the Last Year:   . RArboriculturistin the Last Year:   Transportation Needs:   . LFilm/video editor(Medical):   .Marland Kitchen  Lack of Transportation (Non-Medical):   Physical Activity:   . Days of Exercise per Week:   . Minutes of Exercise per Session:   Stress:   . Feeling of Stress :   Social Connections:   . Frequency of Communication with Friends and Family:   . Frequency of Social Gatherings with Friends and Family:   . Attends Religious Services:   . Active Member of Clubs or Organizations:   . Attends Archivist Meetings:   Marland Kitchen Marital Status:     Family History: Family History  Problem Relation Age of Onset  . Anemia Mother   . Anemia Maternal Grandfather   . Cancer Maternal Grandfather        COLON/LIVER    Allergies: No Known Allergies  Medications Prior to Admission  Medication Sig Dispense Refill Last Dose  . Blood Pressure KIT 1 Device by Does not apply route once a week. To be monitored weekly from home 1 kit 0   . Elastic Bandages & Supports (COMFORT FIT MATERNITY SUPP SM) MISC 1 Units by Does not apply route daily as needed. 1 each 0   . ferrous sulfate 325 (65 FE) MG EC tablet Take 325 mg by mouth 3 (three) times daily with meals.     Marland Kitchen oxyCODONE (ROXICODONE) 5 MG  immediate release tablet Take 1 tablet (5 mg total) by mouth every 4 (four) hours as needed for severe pain. 8 tablet 0   . prenatal vitamin w/FE, FA (PRENATAL 1 + 1) 27-1 MG TABS tablet Take 1 tablet by mouth daily at 12 noon.     . sertraline (ZOLOFT) 100 MG tablet Take 1 tablet (100 mg total) by mouth daily. 90 tablet 2      Review of Systems   All systems reviewed and negative except as stated in HPI  Last menstrual period 03/15/2019. General appearance: alert, cooperative and no distress Lungs: clear to auscultation bilaterally Heart: regular rate and rhythm Abdomen: soft, non-tender; bowel sounds normal Pelvic: n/a Extremities: Homans sign is negative, no sign of DVT DTR's +2 Presentation: cephalic     Prenatal labs: ABO, Rh: --/--/A POS, A POS Performed at [facility] [address] [place],  [address]  [phone]) Antibody: NEG (03/13 1105) Rubella: 1.31 (09/04 0908) RPR: Non Reactive (12/29 0813)  HBsAg: Negative (09/04 0908)  HIV: Non Reactive (12/29 0813)  GBS: --Henderson Cloud (03/08 1559)    Prenatal Transfer Tool  Maternal Diabetes: No Genetic Screening: Normal Maternal Ultrasounds/Referrals: Normal Fetal Ultrasounds or other Referrals:  None Maternal Substance Abuse:  No Significant Maternal Medications:  None Significant Maternal Lab Results: Group B Strep negative  Results for orders placed or performed during the hospital encounter of 12/25/19 (from the past 24 hour(s))  CBC   Collection Time: 12/25/19 11:05 AM  Result Value Ref Range   WBC 10.2 4.0 - 10.5 K/uL   RBC 4.07 3.87 - 5.11 MIL/uL   Hemoglobin 10.8 (L) 12.0 - 15.0 g/dL   HCT 34.4 (L) 36.0 - 46.0 %   MCV 84.5 80.0 - 100.0 fL   MCH 26.5 26.0 - 34.0 pg   MCHC 31.4 30.0 - 36.0 g/dL   RDW 12.9 11.5 - 15.5 %   Platelets 307 150 - 400 K/uL   nRBC 0.0 0.0 - 0.2 %  Basic metabolic panel   Collection Time: 12/25/19 11:05 AM  Result Value Ref Range   Sodium 135 135 - 145 mmol/L    Potassium 4.1 3.5 -  5.1 mmol/L   Chloride 102 98 - 111 mmol/L   CO2 20 (L) 22 - 32 mmol/L   Glucose, Bld 89 70 - 99 mg/dL   BUN 10 6 - 20 mg/dL   Creatinine, Ser 0.90 0.44 - 1.00 mg/dL   Calcium 8.7 (L) 8.9 - 10.3 mg/dL   GFR calc non Af Amer >60 >60 mL/min   GFR calc Af Amer >60 >60 mL/min   Anion gap 13 5 - 15  Type and screen [place]   Collection Time: 12/25/19 11:05 AM  Result Value Ref Range   ABO/RH(D) A POS    Antibody Screen NEG    Sample Expiration      12/28/2019,2359 Performed at [facility], [place] [address] [place], [place] [address]   ABO/Rh   Collection Time: 12/25/19 11:05 AM  Result Value Ref Range   ABO/RH(D)      A POS Performed at [facility] [address], [place],  [address]   Urinalysis, Routine w reflex microscopic   Collection Time: 12/25/19 12:45 PM  Result Value Ref Range   Color, Urine YELLOW YELLOW   APPearance HAZY (A) CLEAR   Specific Gravity, Urine 1.021 1.005 - 1.030   pH 6.0 5.0 - 8.0   Glucose, UA NEGATIVE NEGATIVE mg/dL   Hgb urine dipstick NEGATIVE NEGATIVE   Bilirubin Urine NEGATIVE NEGATIVE   Ketones, ur 80 (A) NEGATIVE mg/dL   Protein, ur 30 (A) NEGATIVE mg/dL   Nitrite NEGATIVE NEGATIVE   Leukocytes,Ua TRACE (A) NEGATIVE   RBC / HPF 0-5 0 - 5 RBC/hpf   WBC, UA 0-5 0 - 5 WBC/hpf   Bacteria, UA RARE (A) NONE SEEN   Squamous Epithelial / LPF 0-5 0 - 5   Mucus PRESENT   SARS CORONAVIRUS 2 (TAT 6-24 HRS) Nasopharyngeal Nasopharyngeal Swab   Collection Time: 12/25/19  2:49 PM   Specimen: Nasopharyngeal Swab  Result Value Ref Range   SARS Coronavirus 2 NEGATIVE NEGATIVE    Patient Active Problem List   Diagnosis Date Noted  . Threatened preterm labor, third trimester 12/25/2019  . Pregnancy headache, antepartum 06/08/2019  . Galactorrhea 06/30/2017  . Migraine without aura 03/01/2014  . Anxiety 03/01/2014  . Insomnia 03/01/2014  . Supervision of other normal pregnancy,  antepartum 12/28/2013    Assessment/Plan:  Leah Hahn is a 29 y.o. G2P1001 at 35w6dhere for active labor.  #Labor: precipitously delivered in MAU. See delivery note #Pain: n/a #FWB: Doppler 115 FHR #ID:  GBS neg #MOF: Breast #MOC: POP #Circ:  Yes   CWende Mott CNM  12/26/2019, 9:03 AM

## 2019-12-26 NOTE — Progress Notes (Signed)
No FHR or Uterine Activity charted for 02:00 due to daylights saving time.

## 2019-12-26 NOTE — Lactation Note (Addendum)
This note was copied from a baby's chart. Lactation Consultation Note  Patient Name: Leah Hahn [id number] Date: 12/26/2019  Mom is a P2 experienced breastfeeding mom.  Breastfed first baby for 2 years with no challenges. Mom with hx anxiety/depression. Dellia Beckwith is LPTI.  Initiated using the DEBP with mom.  Discussed pumping past at least every other breastfeeding and feeding him back 5-7 ml or more if he will take it after every breastfeeding until we know how he will do.  Reviewed LPTI guidelines with parents.Mom pumping when left room and already had started getting colostrum. Left [place] STS and mom pumped on left breast.  Discussed pumping single at this time while keeping [place] STS. Mom has DEBP for home use. Praised decision to breastfeed.  Reviewed [facility] consultation [facility] handout.  Urged parents to call lactation as needed.  Maternal Data    Feeding Feeding Type: Breast Fed  [facility] Score                   Interventions    Lactation Tools Discussed/Used     Consult Status      Deral Schellenberg Michaelle Copas 12/26/2019, 4:51 PM

## 2019-12-26 NOTE — Discharge Summary (Signed)
Postpartum Discharge Summary     Patient Name: Leah Hahn DOB: 1991-08-31 MRN: [id number]  Date of admission: 12/26/2019 Delivering Provider: Wende Mott   Date of discharge: 12/28/2019  Admitting diagnosis: Term pregnancy delivered [O80] Intrauterine pregnancy: [redacted]w[redacted]d    Secondary diagnosis:  Active Problems:   Anxiety   Preterm labor   [redacted] weeks gestation of pregnancy  Additional problems: None     Discharge diagnosis: Preterm Pregnancy Delivered                                                                                                Post partum procedures:None  Augmentation: none  Complications: None  Hospital course:  Onset of Labor With Vaginal Delivery     29y.o. yo G2P1001 at 379w6das admitted in Active Labor on 12/26/2019. Patient had an uncomplicated labor course as follows: precipitous delivery in MAU Membrane Rupture Time/Date: 8:38 AM ,12/26/2019   Intrapartum Procedures: Episiotomy: None [1]                                         Lacerations:  None [1]  Patient had a delivery of a Viable infant. 12/26/2019  Information for the patient's newborn:  Hahn, Boy [id number]]Delivery Method: Vag-Spont     Pateint had an uncomplicated postpartum course. Micronor prescribed on discharge. She is ambulating, tolerating a regular diet, passing flatus, and urinating well. Patient is discharged home in stable condition on 12/28/19.  Delivery time: 8:38 AM    Magnesium Sulfate received: No BMZ received: Yes Rhophylac:No MMR:No Transfusion:No  Physical exam  Vitals:   12/27/19 0605 12/27/19 1716 12/27/19 2120 12/28/19 0531  BP: (!) 110/52 116/74 112/65 124/74  Pulse: 78 72 63 72  Resp: 18 14 15 18   Temp: 98.5 F (36.9 C)  98.3 F (36.8 C) 98 F (36.7 C)  TempSrc: Oral  Oral Oral  SpO2: 100% 99% 98% 99%  Weight:      Height:       General: alert, cooperative and no distress Lochia: appropriate Uterine Fundus:  firm Incision: N/A DVT Evaluation: No evidence of DVT seen on physical exam. Labs: Lab Results  Component Value Date   WBC 10.2 12/25/2019   HGB 10.8 (L) 12/25/2019   HCT 34.4 (L) 12/25/2019   MCV 84.5 12/25/2019   PLT 307 12/25/2019   CMP Latest Ref Rng & Units 12/25/2019  Glucose 70 - 99 mg/dL 89  BUN 6 - 20 mg/dL 10  Creatinine 0.44 - 1.00 mg/dL 0.90  Sodium 135 - 145 mmol/L 135  Potassium 3.5 - 5.1 mmol/L 4.1  Chloride 98 - 111 mmol/L 102  CO2 22 - 32 mmol/L 20(L)  Calcium 8.9 - 10.3 mg/dL 8.7(L)   EdFlavia Shippercore: Edinburgh Postnatal Depression Scale Screening Tool 12/27/2019  I have been able to laugh and see the funny side of things. 0  I have looked forward with enjoyment to things. 0  I have blamed myself unnecessarily when things  went wrong. 1  I have been anxious or worried for no good reason. 3  I have felt scared or panicky for no good reason. 1  Things have been getting on top of me. 2  I have been so unhappy that I have had difficulty sleeping. 0  I have felt sad or miserable. 1  I have been so unhappy that I have been crying. 1  The thought of harming myself has occurred to me. 0  Edinburgh Postnatal Depression Scale Total 9    Discharge instruction: per After Visit Summary and "Baby and Me Booklet".  After visit meds:  Allergies as of 12/28/2019   No Known Allergies     Medication List    TAKE these medications   acetaminophen 325 MG tablet Commonly known as: Tylenol Take 2 tablets (650 mg total) by mouth every 6 (six) hours as needed (for pain scale < 4).   Blood Pressure Kit 1 Device by Does not apply route once a week. To be monitored weekly from home   [place] 1 Units by Does not apply route daily as needed.   ferrous sulfate 325 (65 FE) MG EC tablet Take 325 mg by mouth 3 (three) times daily with meals.   ibuprofen 600 MG tablet Commonly known as: ADVIL Take 1 tablet (600 mg total) by mouth every 6 (six)  hours.   norethindrone 0.35 MG tablet Commonly known as: MICRONOR Take 1 tablet (0.35 mg total) by mouth daily.   oxyCODONE 5 MG immediate release tablet Commonly known as: Roxicodone Take 1 tablet (5 mg total) by mouth every 4 (four) hours as needed for severe pain.   prenatal vitamin w/FE, FA 27-1 MG Tabs tablet Take 1 tablet by mouth daily at 12 noon.   sertraline 100 MG tablet Commonly known as: ZOLOFT Take 1 tablet (100 mg total) by mouth daily.       Diet: routine diet  Activity: Advance as tolerated. Pelvic rest for 6 weeks.   Outpatient follow up:4 weeks Follow up Appt: Future Appointments  Date Time Provider [place]  01/27/2020 10:00 AM Anyanwu, Sallyanne Havers, MD [facility]   Follow up Visit:    Please schedule this patient for Postpartum visit in: 4 weeks with the following provider: Any provider Virtual For C/S patients schedule nurse incision check in weeks 2 weeks: no Low risk pregnancy complicated by: preterm labor Delivery mode:  SVD Anticipated Birth Control:  POPs PP Procedures needed: n/a  Schedule Integrated BH visit: no     Newborn Data: Live born female  Birth Weight: 3295g  APGAR: 7, 8  Newborn Delivery   Birth date/time: 12/26/2019 08:38:00 Delivery type: Vaginal, Spontaneous      Baby Feeding: Breast Disposition:home with mother

## 2019-12-26 NOTE — Discharge Summary (Signed)
Postpartum Discharge Summary      Patient Name: Leah Hahn DOB: 11-29-1990 MRN: [id number]  Date of admission: 12/25/2019 Delivering Provider: This patient has no babies on file.  Date of discharge: 12/26/2019  Admitting diagnosis: Threatened preterm labor, third trimester [O47.03] Intrauterine pregnancy: [redacted]w[redacted]d    Secondary diagnosis:  Active Problems:   Threatened preterm labor, third trimester  Additional problems: None     Dis[facility] diagnosis: [facility] Patient presented to [facility] for nausea/vomiting/diarrhea and contractions. Nausea/vomiting improved in MAU but patient made cervical change from 3 to 5 cm and was admitted to L&D for expectant management. Received one dose of BMZ. Infrequent ctx throughout 16 hour stay in MAU and did not have any cervical change. Patient discharged home with strict return precautions. Patient verbalized understanding of discharge and follow-up instructions and was ambulating without assistance upon discharge.  Magnesium Sulfate received: No BMZ received: Yesx1 Rhophylac:No MMR:No Transfusion:No  Physical exam  Vitals:   12/25/19 1802 12/25/19 1850 12/25/19 2015 12/26/19 0137  BP: 111/70 122/73 114/66 114/61  Pulse: 92 91 78 68  Resp: _0 Temp:   98.2 F (36.8 C)   TempSrc:   Oral   SpO2:      Weight:      Height:       CONSTITUTIONAL: Well-developed, well-nourished female in no acute distress.  HEENT:  Normocephalic, atraumatic. External right and left ear normal. No scleral icterus.  NECK: Normal range of motion SKIN: No rash noted. Not diaphoretic. No erythema. No pallor. MUSCULOSKELETAL: Normal range of motion. No edema noted. NEUROLOGIC: Alert and oriented to person, place, and time. Normal muscle tone coordination. No cranial nerve deficit noted. PSYCHIATRIC: Normal mood and affect. Normal behavior. Normal judgment and thought  content. CARDIOVASCULAR: Normal heart rate noted RESPIRATORY: Effort  normal, no problems with respiration noted ABDOMEN: Soft. Gravid  Dilation: 5 Effacement (%): 90 Cervical Position: Middle Station: -2 Presentation: Vertex Exam by:: Dr. FMarice Potter Labs: Lab Results  Component Value Date   WBC 10.2 12/25/2019   HGB 10.8 (L) 12/25/2019   HCT 34.4 (L) 12/25/2019   MCV 84.5 12/25/2019   PLT 307 12/25/2019   CMP Latest Ref Rng & Units 12/25/2019  Glucose 70 - 99 mg/dL 89  BUN 6 - 20 mg/dL 10  Creatinine 0.44 - 1.00 mg/dL 0.90  Sodium 135 - 145 mmol/L 135  Potassium 3.5 - 5.1 mmol/L 4.1  Chloride 98 - 111 mmol/L 102  CO2 22 - 32 mmol/L 20(L)  Calcium 8.9 - 10.3 mg/dL 8.7(L)   EFlavia ShipperScore: No flowsheet data found.  Discharge instruction: per After Visit Summary and "Baby and Me Booklet".  After visit meds:  Allergies as of 12/26/2019   No Known Allergies     Medication List    TAKE these medications   Blood Pressure Kit 1 Device by Does not apply route once a week. To be monitored weekly from home   CUnion1 Units by Does not apply route daily as needed.   ferrous sulfate 325 (65 FE) MG EC tablet Take 325 mg by mouth 3 (three) times daily with meals.   prenatal vitamin w/FE, FA 27-1 MG Tabs tablet Take 1 tablet by mouth daily at 12 noon.  sertraline 100 MG tablet Commonly known as: ZOLOFT Take 1 tablet (100 mg total) by mouth daily.       Diet: routine diet  Activity: As tolerated.  Follow up Appt: Future Appointments  Date Time Provider [place]  12/27/2019  2:15 PM Caren Macadam, MD [facility]  01/03/2020  2:15 PM Caren Macadam, MD [facility]  01/10/2020  2:45 PM Donnamae Jude, MD [facility]    12/26/2019 Chauncey Mann, MD

## 2019-12-27 ENCOUNTER — Encounter: Payer: Managed Care, Other (non HMO) | Admitting: Family Medicine

## 2019-12-27 ENCOUNTER — Encounter (HOSPITAL_COMMUNITY): Payer: Self-pay | Admitting: Obstetrics and Gynecology

## 2019-12-27 NOTE — Lactation Note (Signed)
This note was copied from a baby's chart. Lactation Consultation Note  Patient Name: Leah Hahn Date: 12/27/2019 Reason for consult: Follow-up assessment;Early term 3-38.6wks  P2 mother whose infant is now 55 hours old.  This is a LPTI at 36+6 weeks with a CGA of 37+0 weeks.  Mother breast fed her first child (now 30 years old) for 2 years.  Mother was breast feeding when I arrived.  Educated on feeding STS (baby was clothed and swaddled) and helped position baby appropriately at the breast.  Baby had a wide gape and flanged lips.  Mother stated that his latch "felt better" and denied any pain.  Even though he had a circumcision this a.m. he began actively sucking and after a few minutes audible swallows noted.  Demonstrated breast compressions and gentle stimulation as he began to tire easily.  As soon as he was stimulated he continued to suck effectively.  Showed mother correct hand/finger placement and how to support her breast for him during feeding.  Observed him feeding for 10 minutes before he became a little sleepy.  Suggested mother burp him.  Baby burped well and mother latched to the opposite breast where he continued to suck.  Reviewed LPTI guideline supplementation and advised parents to feed at least 10-20 mls of EBM after every feeding today/tonight now that he is greater than 24 hours old.  Asked mother about the nipple she has been using and she feels like the gold slow flow nipple may be too fast for him.  Provided the purple extra slow flow nipple and suggested she try this after breast feeding.  Reminded parents to keep feedings (breast and bottle) to a maximum of 30 minutes to conserve energy and calories.  Parents appreciative of help given.  Mother had EBM on her counter that had been out since yesterday and was no longer safe for feeding.  Mother did not realize there was a time limit and neither parent thought to refrigerate the milk.  She knows she cannot use  it today and will put future milk in the refrigerator if baby does not consume it all.    Mother has been using the DEBP and has already been able to obtain a small volume for supplementation.  Encouraged to pump for 15 minutes after every feeding and to immediately give baby the breast milk first prior to any formula supplementation.  She will call her RN for assistance if needed.  Mother has a DEBP for home use.  Father present and supportive.  RN updated.   Maternal Data Formula Feeding for Exclusion: No Has patient been taught Hand Expression?: Yes Does the patient have breastfeeding experience prior to this delivery?: Yes  Feeding Feeding Type: Breast Fed  LATCH Score Latch: Grasps breast easily, tongue down, lips flanged, rhythmical sucking.  Audible Swallowing: Spontaneous and intermittent  Type of Nipple: Everted at rest and after stimulation  Comfort (Breast/Nipple): Filling, red/small blisters or bruises, mild/mod discomfort  Hold (Positioning): Assistance needed to correctly position infant at breast and maintain latch.  LATCH Score: 8  Interventions Interventions: Breast feeding basics reviewed;Assisted with latch;Skin to skin;Breast massage;Hand express;Breast compression;Adjust position;DEBP;Position options;Support pillows  Lactation Tools Discussed/Used Pump Review: (Mother does not wish to review)   Consult Status Consult Status: Follow-up Date: 12/28/19 Follow-up type: In-patient    Leah Hahn 12/27/2019, 12:30 PM

## 2019-12-27 NOTE — Progress Notes (Signed)
MOB was referred for history of depression/anxiety. * Referral screened out by Clinical Social Worker because none of the following criteria appear to apply: ~ History of anxiety/depression during this pregnancy, or of post-partum depression following prior delivery. ~ Diagnosis of anxiety and/or depression within last 3 years. Per further chart review, MOB diagnose with anxiety in 2015.  OR * MOB's symptoms currently being treated with medication and/or therapy.    Please contact the Clinical Social Worker if needs arise, by MOB request, or if MOB scores greater than 9/yes to question 10 on Edinburgh Postpartum Depression Screen.     Marionna Gonia S. Lawsen Arnott, MSW, LCSW [facility] [place] [phone]   

## 2019-12-27 NOTE — Progress Notes (Signed)
Post Partum Day 1 Subjective: Patient reports feeling well. She is tolerating PO. Ambulating and urinating without difficulty. Lochia minimal.  Objective: Blood pressure (!) 110/52, pulse 78, temperature 98.5 F (36.9 C), temperature source Oral, resp. rate 18, height 5\' 7"  (1.702 m), weight 82.6 kg, last menstrual period 03/15/2019, SpO2 100 %, unknown if currently breastfeeding.  Physical Exam:  General: alert, cooperative and appears stated age Lochia: appropriate Uterine Fundus: firm Incision: NA DVT Evaluation: No evidence of DVT seen on physical exam.  Recent Labs    12/25/19 1105  HGB 10.8*  HCT 34.4*    Assessment/Plan: Plan for discharge tomorrow. Okay to discharge today if baby can; RN to page team for orders. Breast and bottle feeding Vitals stable POPs for contraception   LOS: 1 day   12/27/19 12/27/2019, 6:24 AM

## 2019-12-27 NOTE — Lactation Note (Signed)
This note was copied from a baby's chart. Lactation Consultation Note Attempted to see mom, mom sleeping. Patient Name: Leah Hahn [id number] Date: 12/27/2019     Maternal Data    Feeding    LATCH Score                   Interventions    Lactation Tools Discussed/Used     Consult Status      [name] 12/27/2019, 3:03 AM

## 2019-12-28 DIAGNOSIS — Z3A36 36 weeks gestation of pregnancy: Secondary | ICD-10-CM

## 2019-12-28 LAB — SURGICAL PATHOLOGY

## 2019-12-28 MED ORDER — NORETHINDRONE 0.35 MG PO TABS: 1.0000 | ORAL_TABLET | Freq: Every day | ORAL | 11 refills | Status: DC

## 2019-12-28 MED ORDER — IBUPROFEN 600 MG PO TABS: 600.0000 mg | ORAL_TABLET | Freq: Four times a day (QID) | ORAL | 0 refills | Status: DC

## 2019-12-28 MED ORDER — ACETAMINOPHEN 325 MG PO TABS: 650.0000 mg | ORAL_TABLET | Freq: Four times a day (QID) | ORAL | 0 refills | Status: DC | PRN

## 2019-12-28 NOTE — Lactation Note (Signed)
This note was copied from a baby's chart. Lactation Consultation Note  Patient Name: Leah Hahn Date: 12/28/2019   [facility] visit attempted, but room was dark & quiet. LC to return later.   Lurline Hare [facility] 12/28/2019, 7:48 AM

## 2019-12-28 NOTE — Lactation Note (Signed)
This note was copied from a baby's chart. Lactation Consultation Note  Patient Name: Leah Hahn GURKY'H Date: 12/28/2019   Mom said that she is expressing about 40 ml/session. Mom says infant is taking about 15 ml with a bottle (which is offered after most feedings at the breast). We discussed nipple flow rate & Mom will get a slower nipple flow rate when going home. Also discussed was allowing him to take as much as he desires with a bottle (although currently infant does not desire to take more than 15 ml. [Note: Mom has a hx of galactorrhea & a nurse from yesterday documented that Mom already had an abundant supply]).   Mom says that infant is latching well about 80% of the time. For the other 20% of the time, I recommended that she not "push" latching b/c of his gestation (Mom knows that infant may not do as well at the breast as Mom would expect for another 2-4 weeks). If he is not latching well, I suggested that she go ahead and give a bottle & express her milk, instead.  Mom mentioned that infant cluster feeds at times. I suggested that she ensure that he is transferring milk well/taking adequate intake with bottle so that he can be content with feedings/sleep (while still ensuring 8+ feedings/day).  I explained that even if her breasts are full of milk, some late preterm infants cannot transfer milk adequately.  Pacifier was noted in bassinet. I recommended not using pacifier outside of calming for diaper changes. I explained that this infant needs more calories than her older child did at this time (who was born at 69 weeks), so if desire to suck is noted, parents should assume hunger.   Lurline Hare [facility] 12/28/2019, 10:34 AM

## 2019-12-31 ENCOUNTER — Other Ambulatory Visit: Payer: Self-pay | Admitting: Obstetrics and Gynecology

## 2019-12-31 MED ORDER — POLYETHYLENE GLYCOL 3350 17 G PO PACK: 17.0000 g | PACK | Freq: Two times a day (BID) | ORAL | 1 refills | Status: DC | PRN

## 2019-12-31 MED ORDER — METAMUCIL 28 % PO PACK: 1.0000 | PACK | Freq: Two times a day (BID) | ORAL | 1 refills | Status: DC | PRN

## 2019-12-31 MED ORDER — SENNA-DOCUSATE SODIUM 8.6-50 MG PO TABS: 1.0000 | ORAL_TABLET | Freq: Every day | ORAL | 0 refills | Status: DC | PRN

## 2020-01-03 ENCOUNTER — Encounter: Payer: Managed Care, Other (non HMO) | Admitting: Family Medicine

## 2020-01-10 ENCOUNTER — Telehealth: Payer: Self-pay | Admitting: *Deleted

## 2020-01-10 ENCOUNTER — Other Ambulatory Visit: Payer: Self-pay | Admitting: Family Medicine

## 2020-01-10 ENCOUNTER — Encounter: Payer: Managed Care, Other (non HMO) | Admitting: Family Medicine

## 2020-01-10 MED ORDER — BUTALBITAL-APAP-CAFFEINE 50-325-40 MG PO CAPS: 1.0000 | ORAL_CAPSULE | Freq: Four times a day (QID) | ORAL | 1 refills | Status: DC | PRN

## 2020-01-10 NOTE — Telephone Encounter (Signed)
Pt called stating she is having terrible migraines. She had one last week that lasted 3-4 days. Pt has tried Tylenol, Aleve and Ibuprofen and its not helping. Pt states her BP was normal last week and this same thing happened when she had her last baby 5 years ago and we gave her a medication to help. Spoke with Dr Shawnie Pons, pt can have Fioricet called in to try.

## 2020-01-12 ENCOUNTER — Other Ambulatory Visit: Payer: Self-pay | Admitting: *Deleted

## 2020-01-12 MED ORDER — BUTALBITAL-APAP-CAFFEINE 50-325-40 MG PO TABS: 1.0000 | ORAL_TABLET | ORAL | 1 refills | Status: DC | PRN

## 2020-01-18 ENCOUNTER — Encounter (HOSPITAL_COMMUNITY): Payer: Self-pay | Admitting: Obstetrics and Gynecology

## 2020-01-18 ENCOUNTER — Telehealth: Payer: Self-pay | Admitting: *Deleted

## 2020-01-18 ENCOUNTER — Inpatient Hospital Stay (HOSPITAL_COMMUNITY)
Admission: AD | Admit: 2020-01-18 | Discharge: 2020-01-18 | Disposition: A | Discharge status: Home / Self Care | Payer: 59 | Attending: Obstetrics and Gynecology | Admitting: Obstetrics and Gynecology

## 2020-01-18 ENCOUNTER — Inpatient Hospital Stay (HOSPITAL_COMMUNITY): Payer: 59

## 2020-01-18 ENCOUNTER — Other Ambulatory Visit: Payer: Self-pay

## 2020-01-18 DIAGNOSIS — N939 Abnormal uterine and vaginal bleeding, unspecified: Secondary | ICD-10-CM | POA: Diagnosis not present

## 2020-01-18 DIAGNOSIS — Z79899 Other long term (current) drug therapy: Secondary | ICD-10-CM | POA: Diagnosis not present

## 2020-01-18 DIAGNOSIS — R1032 Left lower quadrant pain: Secondary | ICD-10-CM | POA: Diagnosis not present

## 2020-01-18 LAB — URINALYSIS, ROUTINE W REFLEX MICROSCOPIC
Bilirubin Urine: NEGATIVE
Glucose, UA: NEGATIVE mg/dL
Ketones, ur: NEGATIVE mg/dL
Nitrite: NEGATIVE
Protein, ur: NEGATIVE mg/dL
Specific Gravity, Urine: 1.021 (ref 1.005–1.030)
pH: 5 (ref 5.0–8.0)

## 2020-01-18 LAB — CBC
HCT: 37.3 % (ref 36.0–46.0)
Hemoglobin: 11.4 g/dL — ABNORMAL LOW (ref 12.0–15.0)
MCH: 26.7 pg (ref 26.0–34.0)
MCHC: 30.6 g/dL (ref 30.0–36.0)
MCV: 87.4 fL (ref 80.0–100.0)
Platelets: 308 10*3/uL (ref 150–400)
RBC: 4.27 MIL/uL (ref 3.87–5.11)
RDW: 15.9 % — ABNORMAL HIGH (ref 11.5–15.5)
WBC: 5.2 10*3/uL (ref 4.0–10.5)
nRBC: 0 % (ref 0.0–0.2)

## 2020-01-18 NOTE — MAU Provider Note (Signed)
History     CSN: [id number]  Arrival date and time: 01/18/20 1128   First Provider Initiated Contact with Patient 01/18/20 1213      Chief Complaint  Patient presents with  . Abdominal Pain  . Vaginal Bleeding   HPI Leah Hahn is a 29 y.o. [id number] postpartum patient who presents to MAU with chief complaint of recurrent vaginal bleeding and left lower quadrant abdominal pain. She is s/p SVD over intact perineum on 12/26/2019. She states her bleeding changed from bright red to brown-tinged as expected but she has not has a day without bleeding since she delivered. She continues to wear a pantyliner but is not saturating it and changes her pantyliner 2-3 times per day.  Patient also c/o LLQ abdominal pain. She endorses repetitive movements involving reaching toward her left side to care for her baby. She does not take medication or utilize other treatments for this complaint.   Patient is exclusively breastfeeding. She endorses hot and cold flashes throughout the day but denies fever. She has not had intercourse and has not started her postpartum Micronor prescription.  She receives care with [facility].  OB History    Gravida  2   Para  2   Term  1   Preterm  1   AB      Living  2     SAB      TAB      Ectopic      Multiple  0   Live Births  2           Past Medical History:  Diagnosis Date  . Migraine without aura 03/01/2014    Past Surgical History:  Procedure Laterality Date  . COLONOSCOPY  2016  . KNEE SURGERY Left 1992  . WISDOM TOOTH EXTRACTION      Family History  Problem Relation Age of Onset  . Anemia Mother   . Anemia Maternal Grandfather   . Cancer Maternal Grandfather        COLON/LIVER    Social History   Tobacco Use  . Smoking status: Never Smoker  . Smokeless tobacco: Never Used  Substance Use Topics  . Alcohol use: Not Currently    Alcohol/week: 1.0 - 2.0 standard drinks    Types: 1 - 2 Cans of beer  per week    Comment: 3 times week  . Drug use: No    Allergies: No Known Allergies  Medications Prior to Admission  Medication Sig Dispense Refill Last Dose  . acetaminophen (TYLENOL) 325 MG tablet Take 2 tablets (650 mg total) by mouth every 6 (six) hours as needed (for pain scale < 4). 30 tablet 0   . Blood Pressure KIT 1 Device by Does not apply route once a week. To be monitored weekly from home 1 kit 0   . butalbital-acetaminophen-caffeine (FIORICET) 50-325-40 MG tablet Take 1-2 tablets by mouth every 4 (four) hours as needed for headache. 14 tablet 1   . Elastic Bandages & Supports (COMFORT FIT MATERNITY SUPP SM) MISC 1 Units by Does not apply route daily as needed. 1 each 0   . ferrous sulfate 325 (65 FE) MG EC tablet Take 325 mg by mouth 3 (three) times daily with meals.     Marland Kitchen ibuprofen (ADVIL) 600 MG tablet Take 1 tablet (600 mg total) by mouth every 6 (six) hours. 30 tablet 0   . norethindrone (MICRONOR) 0.35 MG tablet Take 1 tablet (0.35 mg total) by mouth  daily. 1 Package 11   . oxyCODONE (ROXICODONE) 5 MG immediate release tablet Take 1 tablet (5 mg total) by mouth every 4 (four) hours as needed for severe pain. 8 tablet 0   . polyethylene glycol (MIRALAX / GLYCOLAX) 17 g packet Take 17 g by mouth 2 (two) times daily as needed. 30 each 1   . prenatal vitamin w/FE, FA (PRENATAL 1 + 1) 27-1 MG TABS tablet Take 1 tablet by mouth daily at 12 noon.     . psyllium (METAMUCIL) 28 % packet Take 1 packet by mouth 2 (two) times daily as needed. 30 packet 1   . sennosides-docusate sodium (SENOKOT-S) 8.6-50 MG tablet Take 1 tablet by mouth daily as needed for constipation. 14 tablet 0   . sertraline (ZOLOFT) 100 MG tablet Take 1 tablet (100 mg total) by mouth daily. 90 tablet 2     Review of Systems  Gastrointestinal: Positive for abdominal pain.  Genitourinary: Positive for vaginal bleeding.  All other systems reviewed and are negative.  Physical Exam   Blood pressure 121/67, pulse  78, temperature 98.1 F (36.7 C), temperature source Oral, resp. rate 18, SpO2 100 %, currently breastfeeding.  Physical Exam  Nursing note and vitals reviewed. Constitutional: She is oriented to person, place, and time. She appears well-developed and well-nourished.  Cardiovascular: Normal rate, normal heart sounds and intact distal pulses.  GI: Soft. Bowel sounds are normal. She exhibits no distension. There is no abdominal tenderness. There is no rebound and no guarding.  Genitourinary:    Uterus normal.     Vaginal discharge present.     Genitourinary Comments: Pelvic exam: External genitalia normal, vaginal walls pink and well rugated, cervix visually closed, no lesions noted. Scant brown-tinged mucus noted. No overt bleeding.  Scant dark brown discharge on pad worn to MAU.   Neurological: She is alert and oriented to person, place, and time.  Skin: Skin is warm and dry.  Psychiatric: She has a normal mood and affect. Her behavior is normal. Judgment and thought content normal.    MAU Course/MDM  Procedures: speculum exam, ultrasound  Patient Vitals for the past 24 hrs:  BP Temp Temp src Pulse Resp SpO2  01/18/20 1358 -- 97.7 F (36.5 C) Oral -- 16 --  01/18/20 1205 -- 98.1 F (36.7 C) Oral -- -- 100 %  01/18/20 1200 121/67 -- -- 78 -- --  01/18/20 1137 119/73 98.4 F (36.9 C) Oral 76 18 97 %   Results for orders placed or performed during the hospital encounter of 01/18/20 (from the past 24 hour(s))  CBC     Status: Abnormal   Collection Time: 01/18/20 11:47 AM  Result Value Ref Range   WBC 5.2 4.0 - 10.5 K/uL   RBC 4.27 3.87 - 5.11 MIL/uL   Hemoglobin 11.4 (L) 12.0 - 15.0 g/dL   HCT 37.3 36.0 - 46.0 %   MCV 87.4 80.0 - 100.0 fL   MCH 26.7 26.0 - 34.0 pg   MCHC 30.6 30.0 - 36.0 g/dL   RDW 15.9 (H) 11.5 - 15.5 %   Platelets 308 150 - 400 K/uL   nRBC 0.0 0.0 - 0.2 %  Urinalysis, Routine w reflex microscopic     Status: Abnormal   Collection Time: 01/18/20 11:53 AM   Result Value Ref Range   Color, Urine YELLOW YELLOW   APPearance CLEAR CLEAR   Specific Gravity, Urine 1.021 1.005 - 1.030   pH 5.0 5.0 - 8.0  Glucose, UA NEGATIVE NEGATIVE mg/dL   Hgb urine dipstick MODERATE (A) NEGATIVE   Bilirubin Urine NEGATIVE NEGATIVE   Ketones, ur NEGATIVE NEGATIVE mg/dL   Protein, ur NEGATIVE NEGATIVE mg/dL   Nitrite NEGATIVE NEGATIVE   Leukocytes,Ua MODERATE (A) NEGATIVE   RBC / HPF 21-50 0 - 5 RBC/hpf   WBC, UA 0-5 0 - 5 WBC/hpf   Bacteria, UA RARE (A) NONE SEEN   Squamous Epithelial / LPF 0-5 0 - 5   Mucus PRESENT    US PELVIS (TRANSABDOMINAL ONLY)  Result Date: 01/18/2020 CLINICAL DATA:  Three weeks postpartum, passing large clots, question retained products of conception EXAM: TRANSABDOMINAL ULTRASOUND OF PELVIS TECHNIQUE: Transabdominal ultrasound examination of the pelvis was performed including evaluation of the uterus, ovaries, adnexal regions, and pelvic cul-de-sac. COMPARISON:  None FINDINGS: Uterus Measurements: 11.1 x 4.9 x 7.7 cm = volume: 220 mL. Retroflexed. Slight prominent size likely reflecting postpartum state. No focal uterine mass Endometrium Thickness: 7 mm. Small amount of endometrial fluid at the lower uterine segment. No additional focal abnormalities of the endometrial complex. Specifically no retained products of conception identified. Right ovary Measurements: 1.7 x 4.2 x 2.6 cm = volume: 9.6 mL. Normal morphology without mass Left ovary Measurements: 1.7 x 3.6 x 1.9 cm = volume: 6.2 mL. Normal morphology without mass Other findings:  No free pelvic fluid or adnexal masses. IMPRESSION: Small amount of nonspecific endometrial fluid at lower uterine segment. No evidence of retained products of conception. Remainder of exam normal. Electronically Signed   By: Lavonia Dana M.D.   On: 01/18/2020 13:34   Assessment and Plan  --29 y.o. [id number] postpartum patient --S/p SVD over intact perineum 3 weeks ago --No concerning findings on physical  exam --No evidence of retained POC on imaging in MAU --Discharge home in stable condition  F/U: --[facility] postpartum appointment 01/27/2020  Darlina Rumpf, Diaz 01/18/2020, 2:41 PM

## 2020-01-18 NOTE — MAU Note (Signed)
Is a little over 3 wks PP (vag 3/14).  Changing 2-3 times a day, but is still passing feathery clots.  Comes and goes in waves.  Some cramping.

## 2020-01-18 NOTE — MAU Note (Signed)
Pt is a G2P2  S/p SVD on 12/26/19 reporting increased bleeding in the last 24 hours with "feather like clots" and light cramping, similar to period cramps.  Pt has h/o irregular period flow and is unsure if this would be considered heavier than her normal period.    Pt is EBF and has not spent more than 4 hours away from her baby since delivery.

## 2020-01-18 NOTE — Discharge Instructions (Signed)

## 2020-01-18 NOTE — Telephone Encounter (Signed)
After talking with Dr Macon Large , pt needs to have formal ultrasound and so she should go to MAU for evaluation and they can do ultrasound and blood work. Called pt to inform her of the recommendations. Pt verbalizes and understands. MAU provider will be notified.

## 2020-01-19 LAB — CULTURE, OB URINE: Culture: NO GROWTH

## 2020-01-27 ENCOUNTER — Other Ambulatory Visit: Payer: Self-pay

## 2020-01-27 ENCOUNTER — Ambulatory Visit (INDEPENDENT_AMBULATORY_CARE_PROVIDER_SITE_OTHER): Payer: 59 | Admitting: Obstetrics & Gynecology

## 2020-01-27 ENCOUNTER — Encounter: Payer: Self-pay | Admitting: Obstetrics & Gynecology

## 2020-01-27 NOTE — Patient Instructions (Signed)
Return to clinic for any scheduled appointments or for any gynecologic concerns as needed.   

## 2020-01-27 NOTE — Progress Notes (Signed)
    Post Partum Visit Note  Leah Hahn is a 29 y.o. G67P1102 female who presents for a postpartum visit. She is 4 weeks postpartum following a normal spontaneous vaginal delivery.  I have fully reviewed the prenatal and intrapartum course. The delivery was at 36.6 gestational weeks.  Anesthesia: none. Postpartum course has been uncomplicated. Baby is doing well.Leah Hahn is feeding by breast. Bleeding having a mix old blood with bright red bleeding, with tiny clots. Bowel function is normal. Bladder function is normal. Patient is not sexually active. Contraception method is oral progesterone-only contraceptive and vasectomy.  Postpartum depression screening: negative.  The following portions of the patient's history were reviewed and updated as appropriate: allergies, current medications, past family history, past medical history, past social history, past surgical history and problem list.  Review of Systems Pertinent items noted in HPI and remainder of comprehensive ROS otherwise negative.    Objective:  Blood pressure 125/82, pulse 69, weight 165 lb (74.8 kg), currently breastfeeding.  General:  alert and no distress   Breasts:  inspection negative, no nipple discharge or bleeding, no masses or nodularity palpable  Lungs: clear to auscultation bilaterally  Heart:  regular rate and rhythm  Abdomen: soft, non-tender; bowel sounds normal; no masses,  no organomegaly   Pelvic:  not evaluated        Assessment:   Normal postpartum exam. No concerns  Plan:   Essential components of care per ACOG recommendations: 1.  Mood and well being: Patient with negative depression screening today. Reviewed local resources for support.  - Patient does not use tobacco.  - hx of drug use? No   2. Infant care and feeding:  -Patient currently breastmilk feeding? Yes . Discussed return to work and pumping. Reviewed importance of draining breast regularly to support lactation. -Social  determinants of health (SDOH) reviewed in EPIC. No concerns  3. Sexuality, contraception and birth spacing - Patient does not want a pregnancy in the next year.  Desired family size is 2 children.  - Reviewed forms of contraception in tiered fashion. Patient desired oral progesterone-only contraceptive today.  Husband also had vasectomy yesterday.   - Discussed birth spacing of 18 months  4. Sleep and fatigue -Encouraged family/partner/community support of 4 hrs of uninterrupted sleep to help with mood and fatigue  5. Physical Recovery  - Discussed patients delivery - Patient has urinary incontinence? Yes, but mild. Doing Kegels to build up perineal muscles. Defers referral to pelvic floor PT for now. - Patient is not safe to resume physical and sexual activity, will resume at 6 weeks.  6.  Health Maintenance - Last pap smear done 06/30/2017 and was normal. Will return later in the year for repeat pap smear.   Leah Collins, MD [facility] [employer]

## 2020-02-28 ENCOUNTER — Telehealth: Payer: Self-pay | Admitting: *Deleted

## 2020-02-28 DIAGNOSIS — F32A Depression, unspecified: Secondary | ICD-10-CM

## 2020-02-28 MED ORDER — SERTRALINE HCL 100 MG PO TABS: 100.0000 mg | ORAL_TABLET | Freq: Every day | ORAL | 2 refills | Status: DC

## 2020-02-28 NOTE — Telephone Encounter (Signed)
Called pt after talking with Dr Vergie Living about her [employer]. Pt can increase to 100mg  of Zoloft daily and then do a  Mood check with him in about 7-10 days. Pt will also consider counseling if medication increase doesn't seem to help.

## 2020-03-06 ENCOUNTER — Other Ambulatory Visit: Payer: Self-pay

## 2020-03-06 ENCOUNTER — Telehealth (INDEPENDENT_AMBULATORY_CARE_PROVIDER_SITE_OTHER): Payer: 59 | Admitting: Obstetrics and Gynecology

## 2020-03-06 DIAGNOSIS — O99345 Other mental disorders complicating the puerperium: Secondary | ICD-10-CM

## 2020-03-06 DIAGNOSIS — F53 Postpartum depression: Secondary | ICD-10-CM | POA: Diagnosis not present

## 2020-03-06 MED ORDER — SERTRALINE HCL 50 MG PO TABS: 50.0000 mg | ORAL_TABLET | Freq: Every day | ORAL | 1 refills | Status: DC

## 2020-03-06 NOTE — Progress Notes (Signed)
I connected with  Leah Hahn on 03/06/20 at  4:00 PM EDT by telephone and verified that I am speaking with the correct person using two identifiers.   I discussed the limitations, risks, security and privacy concerns of performing an evaluation and management service by telephone and the availability of in person appointments. I also discussed with the patient that there may be a patient responsible charge related to this service. The patient expressed understanding and agreed to proceed.  Scheryl Marten, RN 03/06/2020  4:37 PM    hasn't noticed much of a change since medication dose change

## 2020-03-07 DIAGNOSIS — F53 Postpartum depression: Secondary | ICD-10-CM | POA: Insufficient documentation

## 2020-03-07 NOTE — Progress Notes (Signed)
Obstetrics and Gynecology Visit Return Patient Evaluation-TeleVisit  I connected with Leah Hahn on 03/06/20 at  4:00 PM EDT by telephone at home and verified that I am speaking with the correct person using two identifiers.   I discussed the limitations, risks, security and privacy concerns of performing an evaluation and management service by telephone and the availability of in person appointments. I also discussed with the patient that there may be a patient responsible charge related to this service. The patient expressed understanding and agreed to proceed.  Appointment Date: 03/06/2020  Primary Care Provider: Rusty Aus  OBGYN Clinic: [facility]  Chief Complaint: follow up PP depression  History of Present Illness:  Leah Hahn is a 29 y.o. P2 with above CC. She is s/p 3/14 SVD. She was prescribed zoloft 100mg  po qday 7 days ago. She has a h/o depression and was previously on zoloft outside of pregnancy. She states mood is stable.   Review of Systems: as noted in the History of Present Illness. Medications:  Seleny Allbright. Hahn had no medications administered during this visit. Current Outpatient Medications  Medication Sig Dispense Refill  . butalbital-acetaminophen-caffeine (FIORICET) 50-325-40 MG tablet Take 1-2 tablets by mouth every 4 (four) hours as needed for headache. 14 tablet 1  . ferrous sulfate 325 (65 FE) MG EC tablet Take 325 mg by mouth 3 (three) times daily with meals.    Marland Kitchen ibuprofen (ADVIL) 600 MG tablet Take 1 tablet (600 mg total) by mouth every 6 (six) hours. 30 tablet 0  . norethindrone (MICRONOR) 0.35 MG tablet Take 1 tablet (0.35 mg total) by mouth daily. 1 Package 11  . prenatal vitamin w/FE, FA (PRENATAL 1 + 1) 27-1 MG TABS tablet Take 1 tablet by mouth daily at 12 noon.    . sertraline (ZOLOFT) 100 MG tablet Take 1 tablet (100 mg total) by mouth daily. 30 tablet 2  . [START ON 03/13/2020]  sertraline (ZOLOFT) 50 MG tablet Take 1 tablet (50 mg total) by mouth daily. Combine with 100mg  tab qday for total 150mg  po qday dose in one week PRN 30 tablet 1   No current facility-administered medications for this visit.    Allergies: has No Known Allergies.  Physical Exam:  N/A  EPDS score 13, no thoughts of SI  Assessment: Patient stable  Plan:  1. Postpartum depression Recommended BH therapy and benefit of dual therapy with medications d/w pt. It sounds like she has a lot on her plate due to childcare and work; husband sounds like he could be more helpful with duties, but kids are healthy and doing well and sleeping well; she gets about 6 hours of sleep at night  I encouraged her talk with her husband to help with the duties and recommended therapy for either her or both of them which we could set up at our main office. Pt to consider but will increase to 150mg  po in one week if s/s aren't improved   RTC: 3 weeks for PP visit.   I discussed the assessment and treatment plan with the patient. The patient was provided an opportunity to ask questions and all were answered. The patient agreed with the plan and demonstrated an understanding of the instructions.   The patient was advised to call back or seek an in-person evaluation/go to the ED if the symptoms worsen or if the condition fails to improve as anticipated.  I provided 15 minutes of non-face-to-face time during this encounter.  The visit was done via a Phone visit.    Cornelia Copa MD Attending [facility])

## 2020-03-11 IMAGING — US US MFM OB FOLLOW-UP
1 series · 13 of 28 positions shown · non-contrast
Comparison: none

[Series 1: us mfm ob follow-up · 92 acquisitions, 13 frames shown]
[im 4/92]
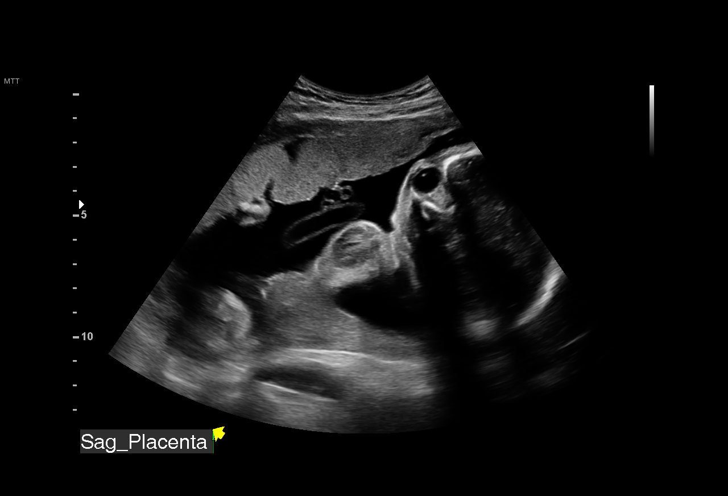
[im 11/92]
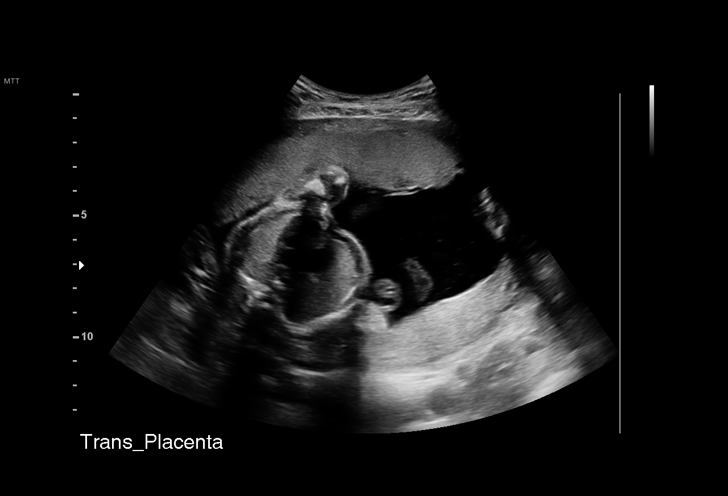
[im 17/92]
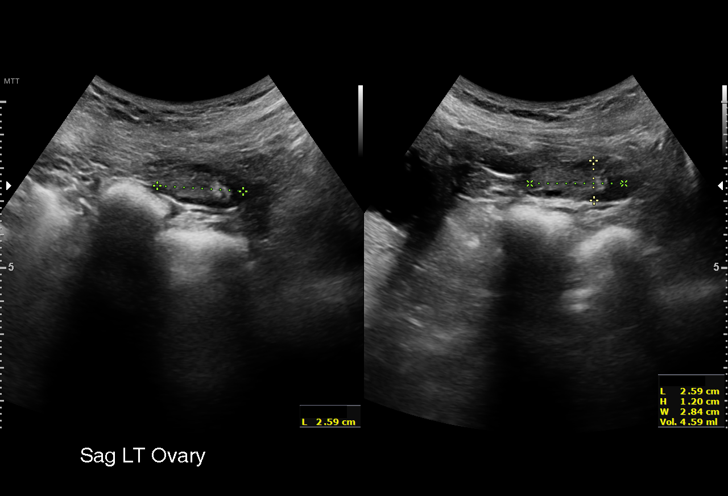
[im 24/92]
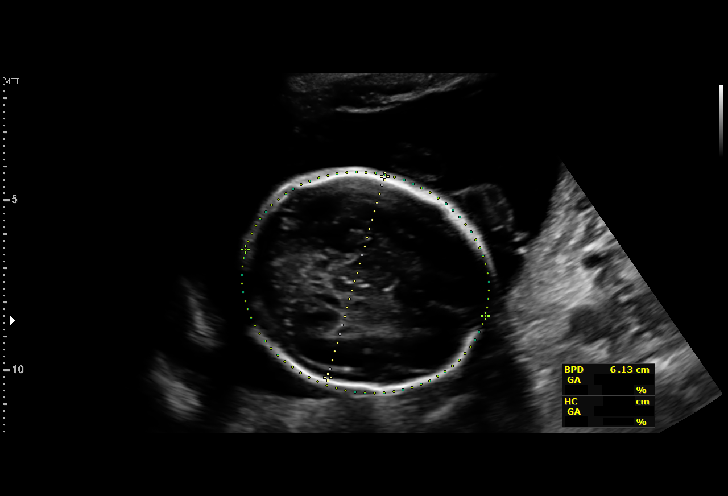
[im 31/92]
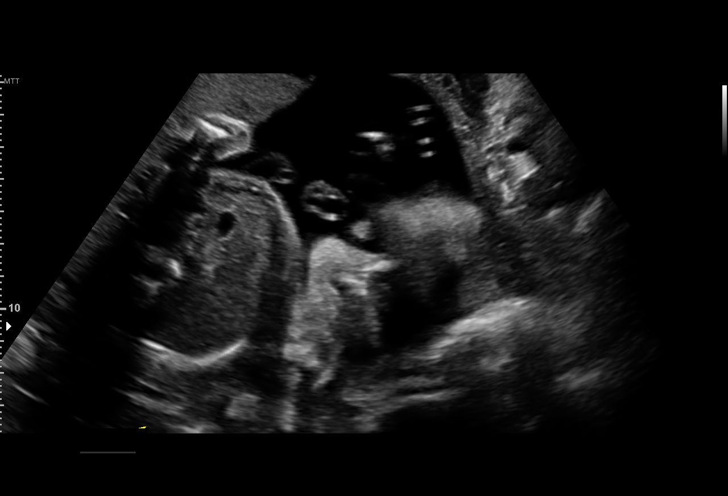
[im 38/92]
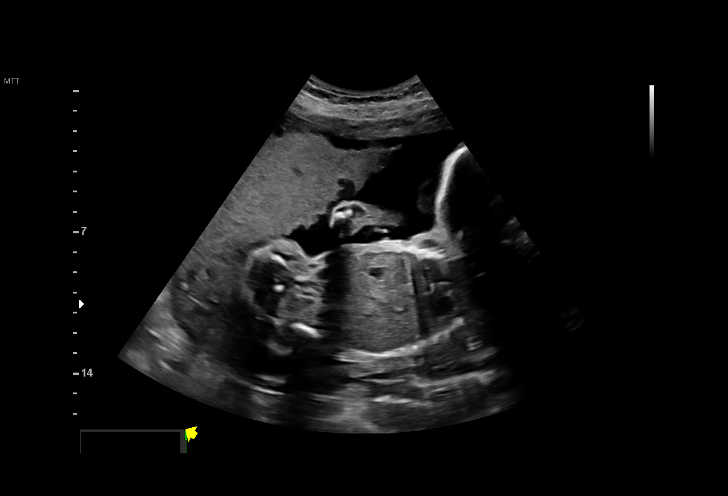
[im 48/92]
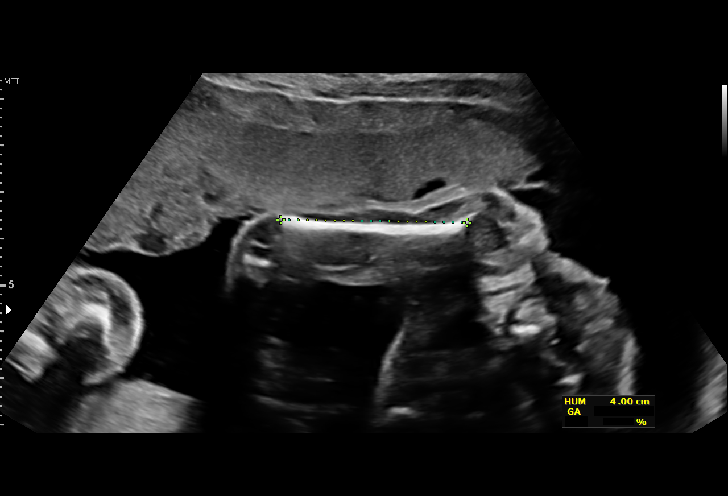
[im 54/92]
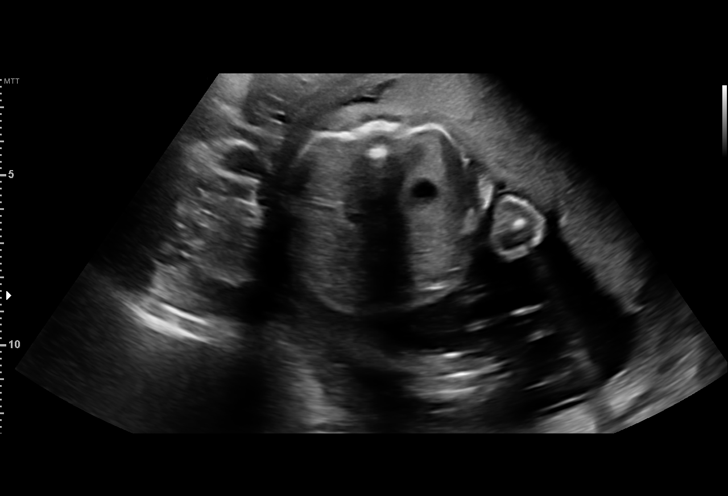
[im 61/92]
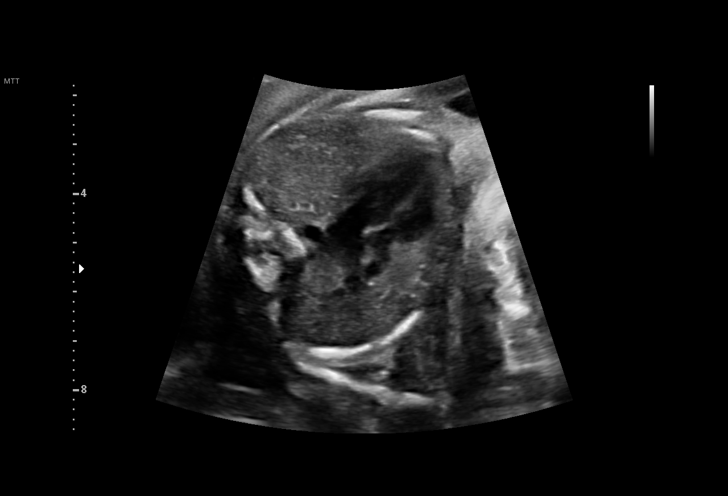
[im 68/92]
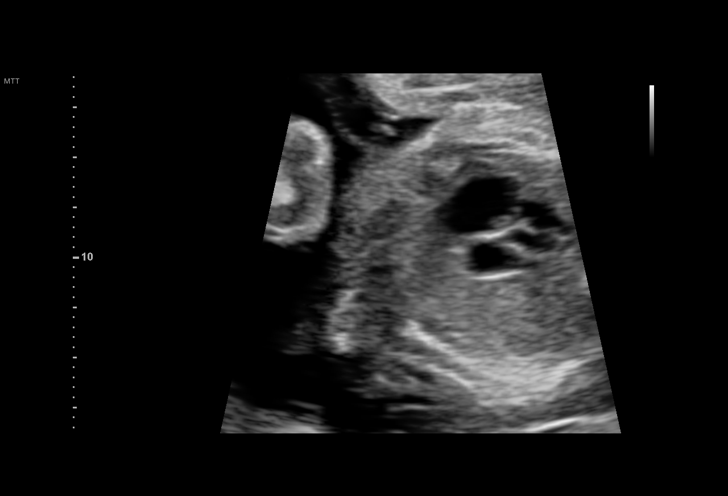
[im 75/92]
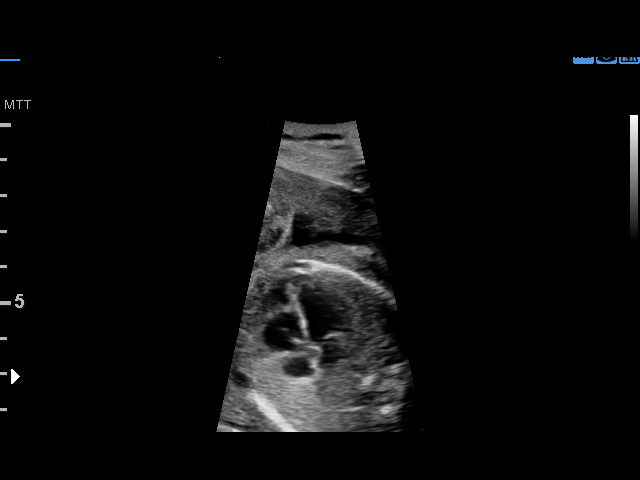
[im 81/92]
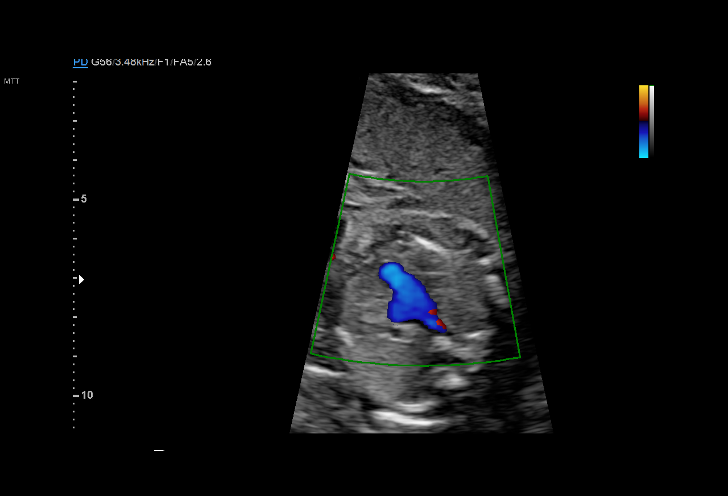
[im 88/92]
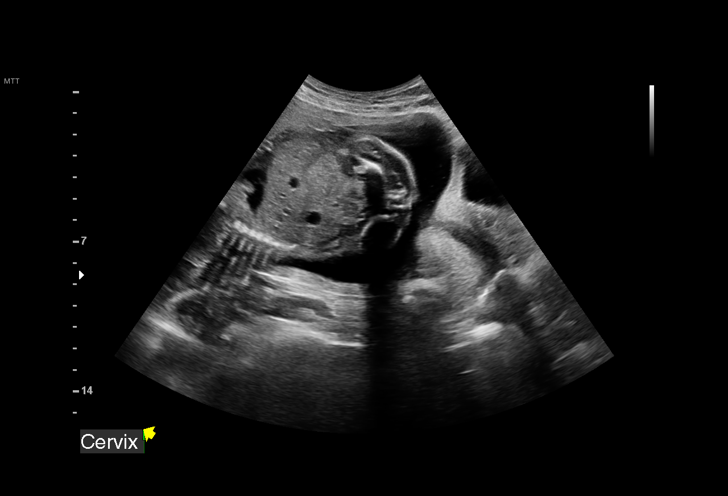

[13 of 28 positions shown; findings below may reference images not displayed]

O'FERRELL


 ----------------------------------------------------------------------

 ----------------------------------------------------------------------
Indications

  23 weeks gestation of pregnancy
  Antenatal follow-up for nonvisualized fetal
  anatomy
  Encounter for antenatal screening,
  unspecified
 ----------------------------------------------------------------------
Fetal Evaluation

 Num Of Fetuses:         1
 Fetal Heart Rate(bpm):  173
 Cardiac Activity:       Observed
 Presentation:           Variable
 Placenta:               Anterior
 P. Cord Insertion:      Previously Visualized

 Amniotic Fluid
 AFI FV:      Within normal limits

                             Largest Pocket(cm)

Biometry

 BPD:      61.4  mm     G. Age:  25w 0d         95  %    CI:        78.76   %    70 - 86
                                                         FL/HC:      18.8   %    19.2 -
 HC:      218.8  mm     G. Age:  23w 6d         66  %    HC/AC:      1.13        1.05 -
 AC:      193.3  mm     G. Age:  24w 0d         70  %    FL/BPD:     66.9   %    71 - 87
 FL:       41.1  mm     G. Age:  23w 2d         45  %    FL/AC:      21.3   %    20 - 24
 HUM:      39.3  mm     G. Age:  24w 0d         61  %
 CER:      25.9  mm     G. Age:  23w 6d         61  %
 LV:        3.6  mm
 Est. FW:     631  gm      1 lb 6 oz     75  %
OB History

 Gravidity:    2         Term:   1
 Living:       1
Gestational Age

 LMP:           27w 1d        Date:  03/15/19                 EDD:   12/20/19
 U/S Today:     24w 0d                                        EDD:   01/11/20
 Best:          23w 1d     Det. By:  Early Ultrasound         EDD:   01/17/20
                                     (06/08/19)
Anatomy

 Cranium:               Appears normal         LVOT:                   Appears normal
 Cavum:                 Appears normal         Aortic Arch:            Appears normal
 Ventricles:            Appears normal         Ductal Arch:            Appears normal
 Choroid Plexus:        Previously seen        Diaphragm:              Appears normal
 Cerebellum:            Appears normal         Stomach:                Appears normal, left
                                                                       sided
 Posterior Fossa:       Previously seen        Abdomen:                Previously seen
 Nuchal Fold:           Previously seen        Abdominal Wall:         Appears nml (cord
                                                                       insert, abd wall)
 Face:                  Orbits and profile     Cord Vessels:           Appears normal (3
                        previously seen                                vessel cord)
 Lips:                  Previously seen        Kidneys:                Appear normal
 Palate:                Not well visualized    Bladder:                Appears normal
 Thoracic:              Previously seen        Spine:                  Previously seen
 Heart:                 Appears normal         Upper Extremities:      Previously seen
                        (4CH, axis, and
                        situs)
 RVOT:                  Appears normal         Lower Extremities:      Previously seen

 Other:  Male gender previously seen.  Heels previously seen.Yoel. Open
         hands visualized previously.  Nasal bone visualized previously.
         Technically difficult due to fetal position.
Cervix Uterus Adnexa

 Cervix
 Length:            3.6  cm.
 Normal appearance by transabdominal scan.

 Uterus
 No abnormality visualized.

 Left Ovary
 Within normal limits. No adnexal mass visualized.

 Right Ovary
 Within normal limits. No adnexal mass visualized.

 Cul De Sac
 No free fluid seen.

 Adnexa
 No abnormality visualized.
Impression

 Normal interval growth.
 Anatomy completed
 Normal amniotic fluid and growth
Recommendations

 Follow up as clinically indicated.

## 2020-03-16 ENCOUNTER — Ambulatory Visit: Payer: 59 | Admitting: Obstetrics and Gynecology

## 2020-04-06 ENCOUNTER — Ambulatory Visit: Payer: 59 | Admitting: Obstetrics and Gynecology

## 2020-04-20 ENCOUNTER — Ambulatory Visit (INDEPENDENT_AMBULATORY_CARE_PROVIDER_SITE_OTHER): Payer: 59 | Admitting: Obstetrics and Gynecology

## 2020-04-20 ENCOUNTER — Other Ambulatory Visit (HOSPITAL_COMMUNITY)
Admission: RE | Admit: 2020-04-20 | Discharge: 2020-04-20 | Disposition: A | Discharge status: Home / Self Care | Payer: 59 | Source: Ambulatory Visit | Attending: Obstetrics and Gynecology | Admitting: Obstetrics and Gynecology

## 2020-04-20 ENCOUNTER — Encounter: Payer: Self-pay | Admitting: Obstetrics and Gynecology

## 2020-04-20 ENCOUNTER — Other Ambulatory Visit: Payer: Self-pay

## 2020-04-20 VITALS — BP 114/72 | HR 84 | Wt 163.0 lb

## 2020-04-20 DIAGNOSIS — Z01419 Encounter for gynecological examination (general) (routine) without abnormal findings: Secondary | ICD-10-CM | POA: Diagnosis not present

## 2020-04-20 DIAGNOSIS — Z124 Encounter for screening for malignant neoplasm of cervix: Secondary | ICD-10-CM

## 2020-04-20 NOTE — Progress Notes (Signed)
  Obstetrics and Gynecology Visit Return Patient Evaluation  Appointment Date: 04/21/2020  Primary Care Provider: Danella Penton  OB[facility]N Clinic: [facility]  Chief Complaint: routine pap smear  History of Present Illness:  Leah Hahn is a 29 y.o. here for above CC. She is continuing to breast feed and her mood is stable. Husband is s/p vasectomy and she plans to d/c POPs once post vasectomy labs are negative. Last pap was three years ago.   Review of Systems: as noted in the History of Present Illness.  Medications:  Tatianna Ibbotson. Hahn had no medications administered during this visit. Current Outpatient Medications  Medication Sig Dispense Refill  . norethindrone (MICRONOR) 0.35 MG tablet Take 1 tablet (0.35 mg total) by mouth daily. 1 Package 11  . prenatal vitamin w/FE, FA (PRENATAL 1 + 1) 27-1 MG TABS tablet Take 1 tablet by mouth daily at 12 noon.    . sertraline (ZOLOFT) 100 MG tablet Take 1 tablet (100 mg total) by mouth daily. 30 tablet 2   No current facility-administered medications for this visit.    Allergies: has No Known Allergies.  Physical Exam:  BP 114/72   Pulse 84   Wt 163 lb (73.9 kg)   BMI 25.53 kg/m  Body mass index is 25.53 kg/m. General appearance: Well nourished, well developed female in no acute distress.  Neuro/Psych:  Normal mood and affect.    Pelvic exam:  EGBUS, vaginal vault and cervix: within normal limits   Assessment: pt doing well  Plan:  1. Cervical cancer screening Mood is good on the zoloft.  - Cytology - PAP   RTC: PRN  Cornelia Copa MD Attending [facility])

## 2020-04-25 LAB — CYTOLOGY - PAP
Comment: NEGATIVE
Diagnosis: NEGATIVE
High risk HPV: NEGATIVE

## 2020-07-08 IMAGING — US US PELVIS COMPLETE
1 series · 15 of 22 positions shown · non-contrast
Comparison: None

CLINICAL DATA: Three weeks postpartum, passing large clots,
question retained products of conception

EXAM:
TRANSABDOMINAL ULTRASOUND OF PELVIS
TECHNIQUE: Transabdominal ultrasound examination of the pelvis was performed
including evaluation of the uterus, ovaries, adnexal regions, and
pelvic cul-de-sac.

[Series 1: us pelvis complete · 22 acquisitions, 15 frames shown]
[im 1/22]
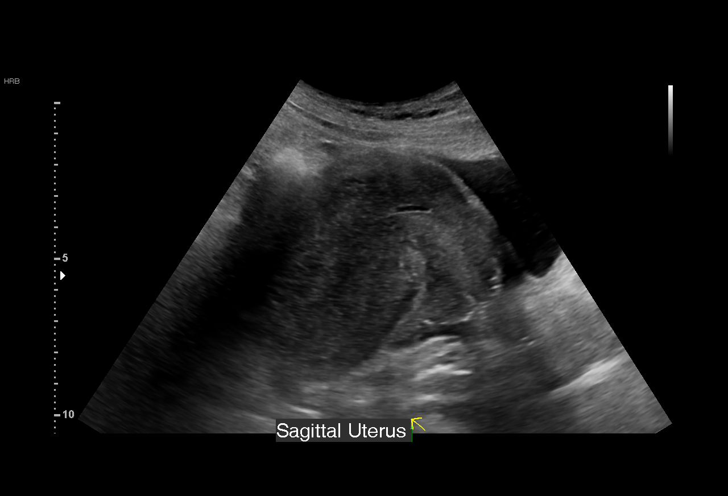
[im 3/22]
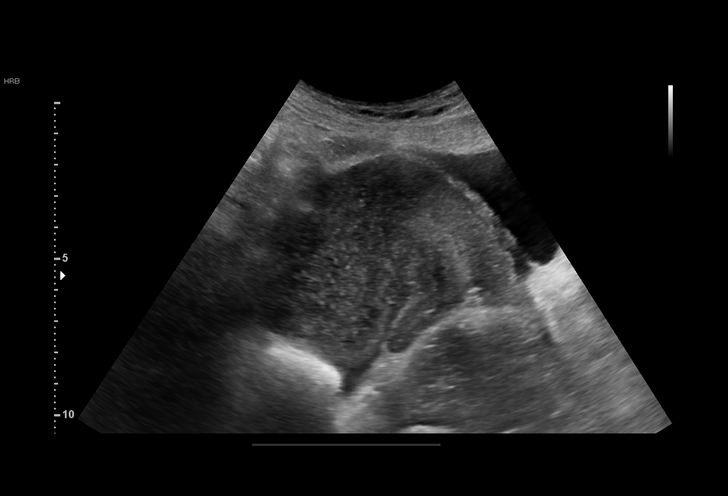
[im 4/22]
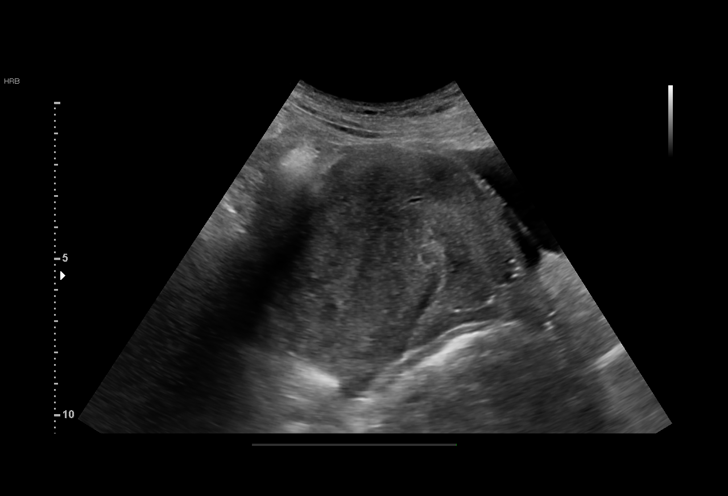
[im 6/22]
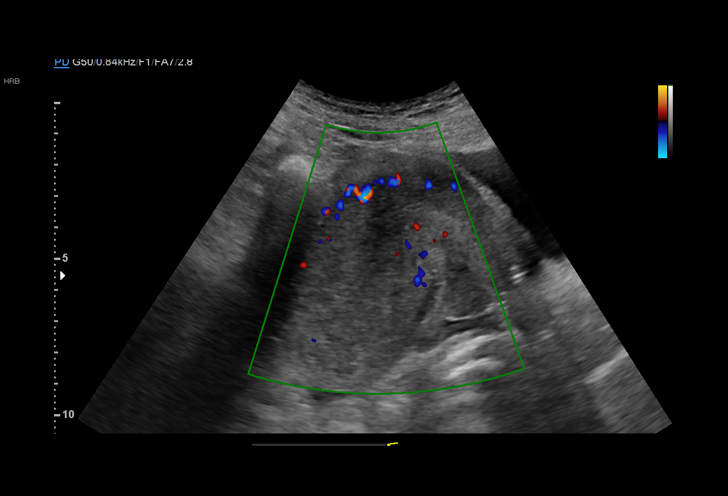
[im 7/22]
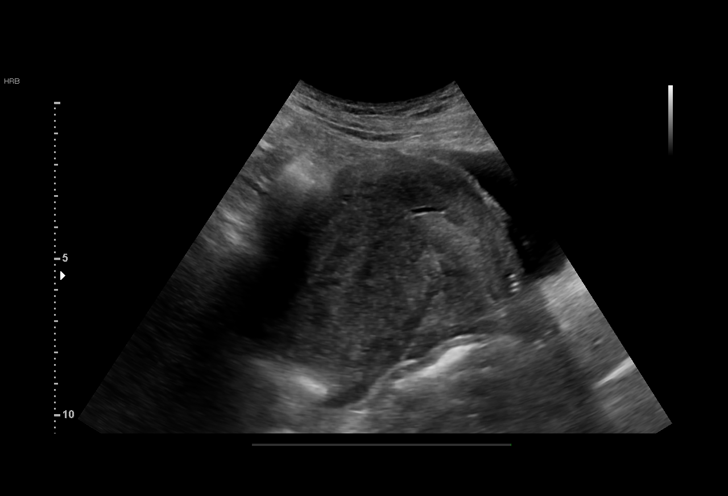
[im 9/22]
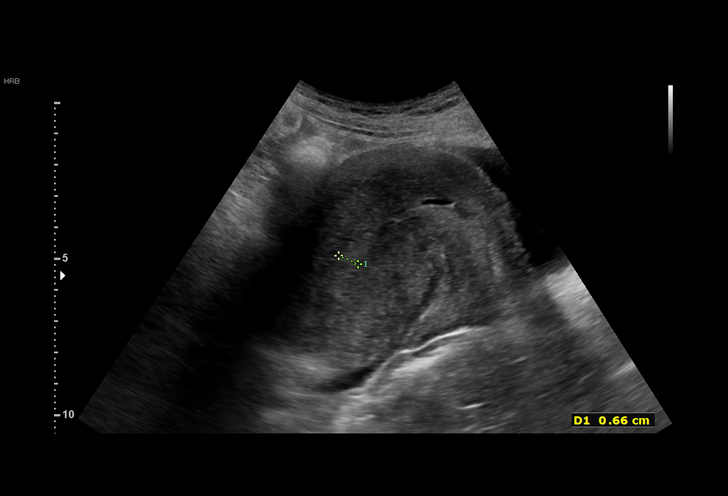
[im 10/22]
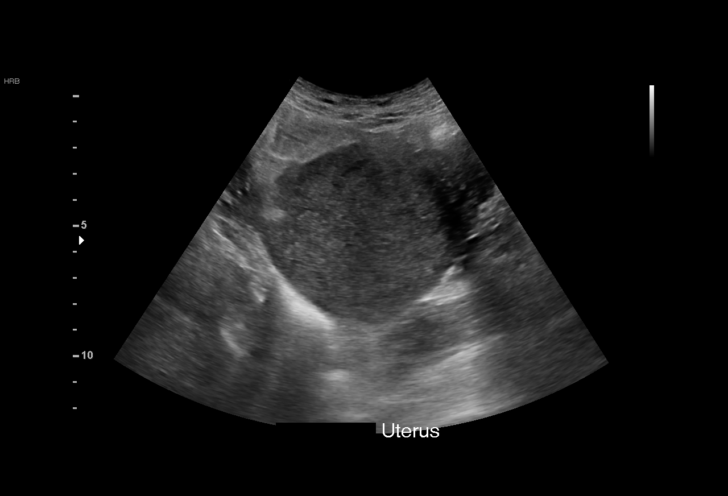
[im 12/22]
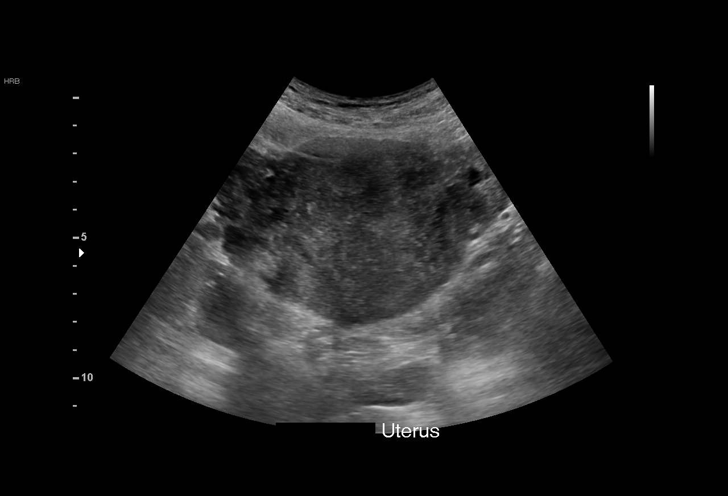
[im 13/22]
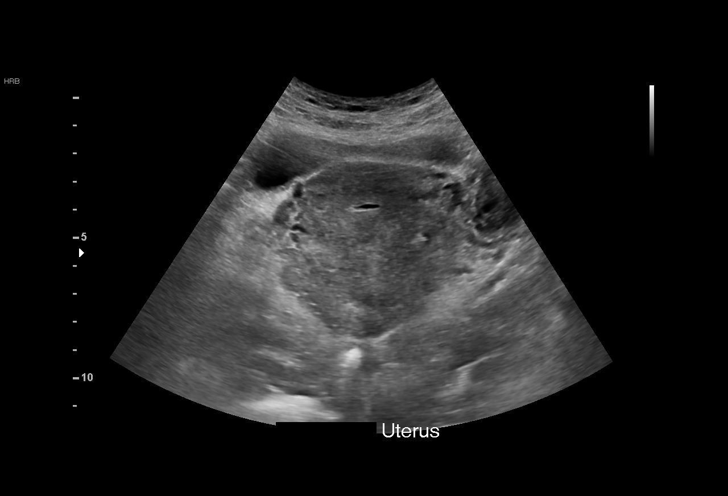
[im 14/22]
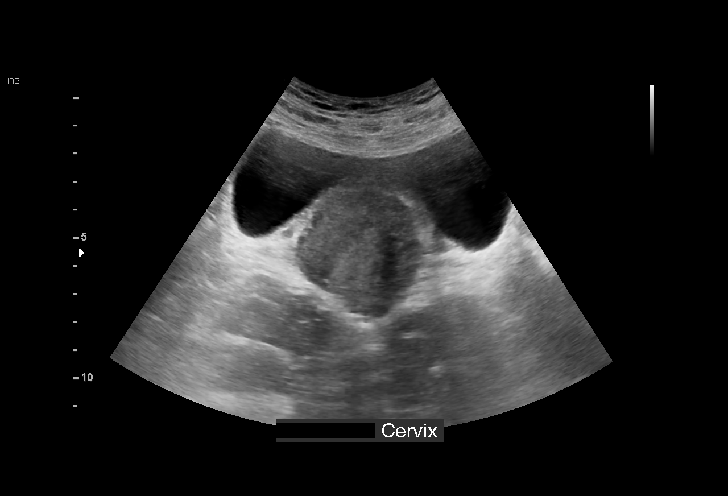
[im 16/22]
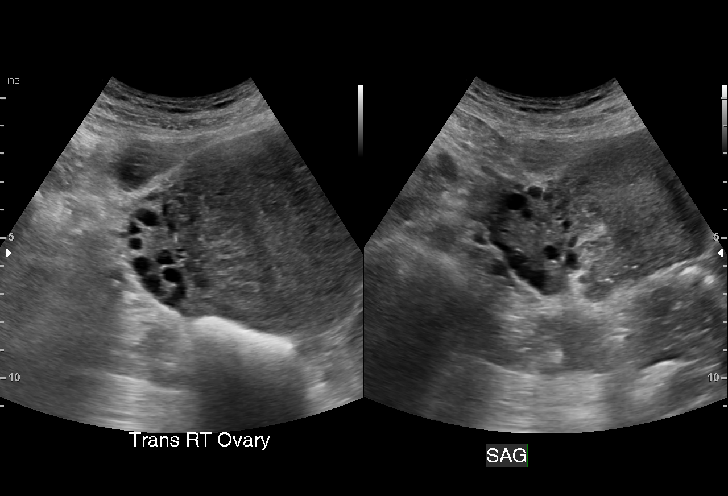
[im 17/22]
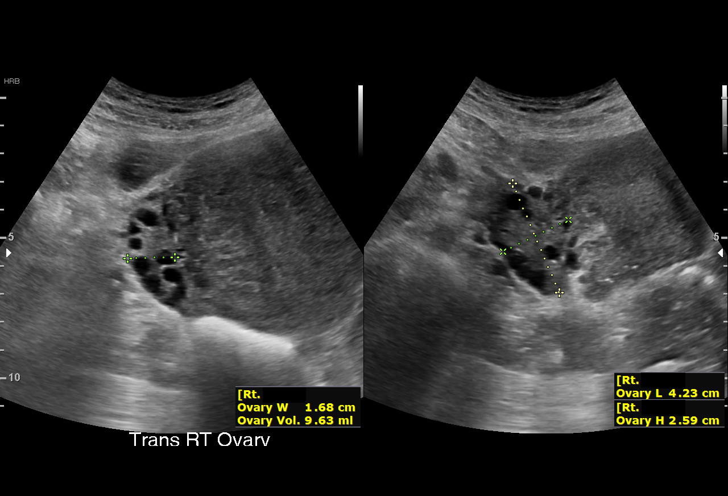
[im 19/22]
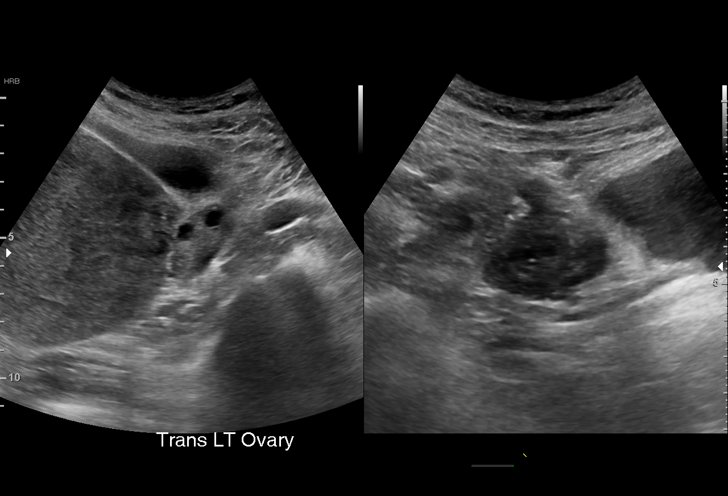
[im 20/22]
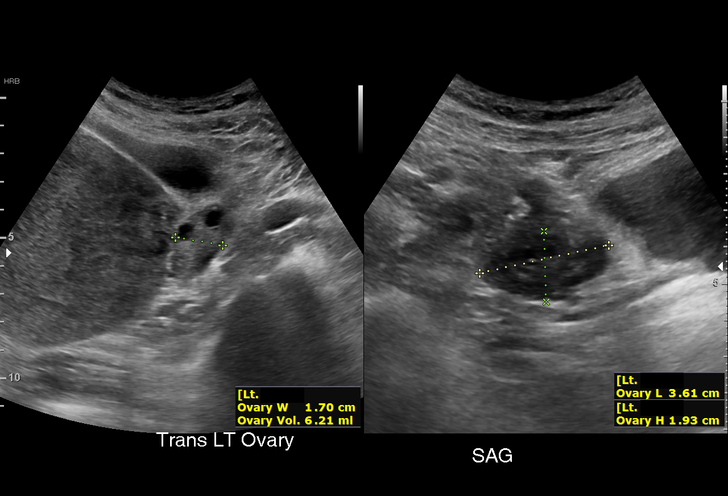
[im 22/22]
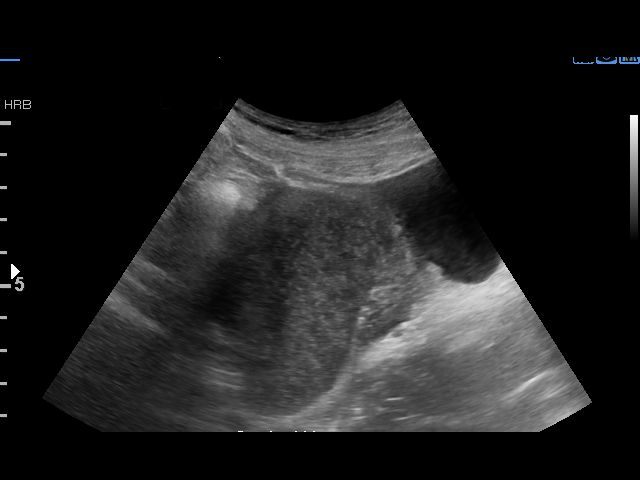

[15 of 22 positions shown; findings below may reference images not displayed]

FINDINGS: Uterus

Measurements: 11.1 x 4.9 x 7.7 cm = volume: 220 mL. Retroflexed.
Slight prominent size likely reflecting postpartum state. No focal
uterine mass

Endometrium

Thickness: 7 mm. Small amount of endometrial fluid at the lower
uterine segment. No additional focal abnormalities of the
endometrial complex. Specifically no retained products of conception
identified.

Right ovary

Measurements: 1.7 x 4.2 x 2.6 cm = volume: 9.6 mL. Normal morphology
without mass

Left ovary

Measurements: 1.7 x 3.6 x 1.9 cm = volume: 6.2 mL. Normal morphology
without mass

Other findings:  No free pelvic fluid or adnexal masses.
IMPRESSION: Small amount of nonspecific endometrial fluid at lower uterine
segment.

No evidence of retained products of conception.

Remainder of exam normal.

## 2020-07-14 ENCOUNTER — Other Ambulatory Visit: Payer: Self-pay | Admitting: *Deleted

## 2020-07-14 MED ORDER — BUTALBITAL-APAP-CAFFEINE 50-325-40 MG PO TABS: 1.0000 | ORAL_TABLET | ORAL | 1 refills | Status: DC | PRN

## 2020-08-04 ENCOUNTER — Ambulatory Visit: Payer: 59 | Admitting: Physician Assistant

## 2020-08-04 ENCOUNTER — Other Ambulatory Visit: Payer: Self-pay

## 2020-08-04 ENCOUNTER — Encounter: Payer: Self-pay | Admitting: Physician Assistant

## 2020-08-04 VITALS — BP 119/75 | HR 80 | Ht 67.0 in | Wt 160.0 lb

## 2020-08-04 DIAGNOSIS — G43109 Migraine with aura, not intractable, without status migrainosus: Secondary | ICD-10-CM | POA: Diagnosis not present

## 2020-08-04 MED ORDER — NAPROXEN 500 MG PO TABS: 500.0000 mg | ORAL_TABLET | Freq: Two times a day (BID) | ORAL | 2 refills | Status: DC

## 2020-08-04 MED ORDER — METOCLOPRAMIDE HCL 10 MG PO TABS: 10.0000 mg | ORAL_TABLET | Freq: Three times a day (TID) | ORAL | 1 refills | Status: DC | PRN

## 2020-08-04 MED ORDER — BUTALBITAL-APAP-CAFFEINE 50-325-40 MG PO TABS: 1.0000 | ORAL_TABLET | ORAL | 1 refills | Status: DC | PRN

## 2020-08-04 MED ORDER — SUMATRIPTAN SUCCINATE 100 MG PO TABS: 100.0000 mg | ORAL_TABLET | Freq: Once | ORAL | 11 refills | Status: DC | PRN

## 2020-08-04 NOTE — Progress Notes (Signed)
History:  Leah Hahn is a 28 y.o. [id number] who presents to clinic today for headache followup.  She has started to have flashes of light in the corner of her left eye 30 minutes to 1 hour prior to developing the pain of headache.  She also has nausea during that time prior to the pain and this is perhaps the worst part.  Quality of migraine otherwise unchanged. She has used butalbital for acute headache with limited efficacy.  She has used it very sparingly, waiting until the pain was severe. Aspirin/advil/aleve used and aleve has been most helpful but it doesn't completely resolve the head pain.  Dramamine for nausea helps some.   Her baby is now 7 months.  He is still nursing, although not exclusively.  He continues to wake through the night.  He goes to work with [employer] also. She states no contraceptive medication as her husband had a vasectomy.  HIT6:70 Number of days in the last 4 weeks with:  Severe headache: 5 Moderate headache: 6 Mild headache: 7  No headache: 10   Past Medical History:  Diagnosis Date  . Migraine without aura 03/01/2014    Social History   Socioeconomic History  . Marital status: Married    Spouse name: Not on file  . Number of children: Not on file  . Years of education: Not on file  . Highest education level: Not on file  Occupational History  . Not on file  Tobacco Use  . Smoking status: Never Smoker  . Smokeless tobacco: Never Used  Vaping Use  . Vaping Use: Never used  Substance and Sexual Activity  . Alcohol use: Not Currently    Alcohol/week: 1.0 - 2.0 standard drink    Types: 1 - 2 Cans of beer per week    Comment: 3 times week  . Drug use: No  . Sexual activity: Yes    Partners: Male    Birth control/protection: None  Other Topics Concern  . Not on file  Social History Narrative  . Not on file   Social Determinants of Health   Financial Resource Strain:   . Difficulty of Paying Living Expenses: Not on file  Food  Insecurity:   . Worried About Programme researcher, broadcasting/film/video in the Last Year: Not on file  . Ran Out of Food in the Last Year: Not on file  Transportation Needs:   . Lack of Transportation (Medical): Not on file  . Lack of Transportation (Non-Medical): Not on file  Physical Activity:   . Days of Exercise per Week: Not on file  . Minutes of Exercise per Session: Not on file  Stress:   . Feeling of Stress : Not on file  Social Connections:   . Frequency of Communication with Friends and Family: Not on file  . Frequency of Social Gatherings with Friends and Family: Not on file  . Attends Religious Services: Not on file  . Active Member of Clubs or Organizations: Not on file  . Attends Banker Meetings: Not on file  . Marital Status: Not on file  Intimate Partner Violence:   . Fear of Current or Ex-Partner: Not on file  . Emotionally Abused: Not on file  . Physically Abused: Not on file  . Sexually Abused: Not on file    Family History  Problem Relation Age of Onset  . Anemia Mother   . Anemia Maternal Grandfather   . Cancer Maternal Grandfather  COLON/LIVER    No Known Allergies  Current Outpatient Medications on File Prior to Visit  Medication Sig Dispense Refill  . butalbital-acetaminophen-caffeine (FIORICET) 50-325-40 MG tablet Take 1-2 tablets by mouth every 4 (four) hours as needed for headache. 14 tablet 1  . ibuprofen (ADVIL) 600 MG tablet Take 1 tablet (600 mg total) by mouth every 6 (six) hours. 30 tablet 0  . prenatal vitamin w/FE, FA (PRENATAL 1 + 1) 27-1 MG TABS tablet Take 1 tablet by mouth daily at 12 noon.    . sertraline (ZOLOFT) 100 MG tablet Take 1 tablet (100 mg total) by mouth daily. 30 tablet 2  . ferrous sulfate 325 (65 FE) MG EC tablet Take 325 mg by mouth 3 (three) times daily with meals. (Patient not taking: Reported on 08/04/2020)    . norethindrone (MICRONOR) 0.35 MG tablet Take 1 tablet (0.35 mg total) by mouth daily. (Patient not taking:  Reported on 08/04/2020) 1 Package 11  . sertraline (ZOLOFT) 50 MG tablet Take 1 tablet (50 mg total) by mouth daily. Combine with 100mg  tab qday for total 150mg  po qday dose in one week PRN (Patient not taking: Reported on 08/04/2020) 30 tablet 1   No current facility-administered medications on file prior to visit.     Review of Systems:  All pertinent positive/negative included in HPI, all other review of systems are negative   Objective:  Physical Exam BP 119/75   Pulse 80   Ht 5\' 7"  (1.702 m)   Wt 160 lb (72.6 kg)   LMP 08/04/2020 (Approximate)   Breastfeeding Yes   BMI 25.06 kg/m  CONSTITUTIONAL: Well-developed, well-nourished female in no acute distress.  EYES: EOM intact ENT: Normocephalic CARDIOVASCULAR: Regular rate RESPIRATORY: Normal rate.  MUSCULOSKELETAL: Normal ROM SKIN: Warm, dry without erythema  NEUROLOGICAL: Alert, oriented, CN II-XII grossly intact, Appropriate balance PSYCH: Normal behavior, mood   Assessment & Plan:  Assessment: New diagnosis of Migraine WITH AURA  Plan: Pt advised she may not use estrogen due to the increased risk of aura Begin sumatriptan for acute migraine - take early - take with naproxen for improved efficacy.  Advised to pump and dump after taking sumatriptan.  It can be repeated after 2 hours but must be limited to 2/day, 2 days per week.   Reglan for nausea asap Sleeping through the night would be helpful but not possible currently.  Pt aware she may see change in migraine when she discontinues nursing. Will wait for rx for migraine prevention until that time.     Follow-up in 12 months or sooner PRN  08/06/2020, PA-C 08/04/2020 10:52 AM

## 2020-08-04 NOTE — Patient Instructions (Signed)

## 2020-08-09 ENCOUNTER — Other Ambulatory Visit: Payer: Self-pay | Admitting: *Deleted

## 2020-08-09 ENCOUNTER — Encounter: Payer: Self-pay | Admitting: *Deleted

## 2020-08-09 NOTE — Telephone Encounter (Signed)
erroneous

## 2020-09-12 ENCOUNTER — Other Ambulatory Visit: Payer: Self-pay | Admitting: *Deleted

## 2020-09-12 NOTE — Progress Notes (Signed)
Pt sent message that she did not pick up her RX's in time and the pharmacy discarded them and needs them resent in.

## 2020-09-13 MED ORDER — NAPROXEN 500 MG PO TABS: 500.0000 mg | ORAL_TABLET | Freq: Two times a day (BID) | ORAL | 2 refills | Status: DC

## 2020-09-13 MED ORDER — SUMATRIPTAN SUCCINATE 100 MG PO TABS: 100.0000 mg | ORAL_TABLET | Freq: Once | ORAL | 11 refills | Status: DC | PRN

## 2020-09-13 MED ORDER — METOCLOPRAMIDE HCL 10 MG PO TABS: 10.0000 mg | ORAL_TABLET | Freq: Three times a day (TID) | ORAL | 1 refills | Status: DC | PRN

## 2020-10-27 ENCOUNTER — Encounter: Payer: 59 | Admitting: Physician Assistant

## 2020-11-02 ENCOUNTER — Other Ambulatory Visit: Payer: Self-pay | Admitting: Family Medicine

## 2020-11-02 DIAGNOSIS — F32A Depression, unspecified: Secondary | ICD-10-CM

## 2020-11-02 DIAGNOSIS — O99341 Other mental disorders complicating pregnancy, first trimester: Secondary | ICD-10-CM

## 2023-07-01 ENCOUNTER — Other Ambulatory Visit: Payer: Self-pay | Admitting: Podiatry

## 2023-07-01 ENCOUNTER — Ambulatory Visit (INDEPENDENT_AMBULATORY_CARE_PROVIDER_SITE_OTHER): Payer: BC Managed Care – PPO

## 2023-07-01 ENCOUNTER — Ambulatory Visit (INDEPENDENT_AMBULATORY_CARE_PROVIDER_SITE_OTHER): Payer: BC Managed Care – PPO | Admitting: Podiatry

## 2023-07-01 DIAGNOSIS — S9002XA Contusion of left ankle, initial encounter: Secondary | ICD-10-CM

## 2023-07-01 NOTE — Progress Notes (Signed)
Subjective:  Patient ID: Leah Hahn, female    DOB: 10/27/1990,  MRN: [id number]  Chief Complaint  Patient presents with   Foot Pain    Rolled left ankle a few weeks ago and is still having some discomfort with it     32 y.o. female presents with the above complaint.  Patient presents with complaint left ankle inversion injury.  Patient states she rolled a few weeks ago has not gotten any better there is still some discomfort with that.  She wanted to get it evaluated.  She did not get it immobilized.  Denies any other acute complaints.   Review of Systems: Negative except as noted in the HPI. Denies N/V/F/Ch.  Past Medical History:  Diagnosis Date   Migraine without aura 03/01/2014    Current Outpatient Medications:    butalbital-acetaminophen-caffeine (FIORICET) 50-325-40 MG tablet, Take 1-2 tablets by mouth every 4 (four) hours as needed for headache., Disp: 30 tablet, Rfl: 1   ferrous sulfate 325 (65 FE) MG EC tablet, Take 325 mg by mouth 3 (three) times daily with meals. (Patient not taking: Reported on 08/04/2020), Disp: , Rfl:    ibuprofen (ADVIL) 600 MG tablet, Take 1 tablet (600 mg total) by mouth every 6 (six) hours., Disp: 30 tablet, Rfl: 0   metoCLOPramide (REGLAN) 10 MG tablet, Take 1 tablet (10 mg total) by mouth every 8 (eight) hours as needed for nausea., Disp: 30 tablet, Rfl: 1   naproxen (NAPROSYN) 500 MG tablet, Take 1 tablet (500 mg total) by mouth 2 (two) times daily with a meal. As needed for pain, Disp: 60 tablet, Rfl: 2   norethindrone (MICRONOR) 0.35 MG tablet, Take 1 tablet (0.35 mg total) by mouth daily. (Patient not taking: Reported on 08/04/2020), Disp: 1 Package, Rfl: 11   prenatal vitamin w/FE, FA (PRENATAL 1 + 1) 27-1 MG TABS tablet, Take 1 tablet by mouth daily at 12 noon., Disp: , Rfl:    sertraline (ZOLOFT) 100 MG tablet, Take 1 tablet (100 mg total) by mouth daily., Disp: 30 tablet, Rfl: 2   sertraline (ZOLOFT) 50 MG tablet, Take 1  tablet (50 mg total) by mouth daily. Combine with 100mg  tab qday for total 150mg  po qday dose in one week PRN (Patient not taking: Reported on 08/04/2020), Disp: 30 tablet, Rfl: 1   SUMAtriptan (IMITREX) 100 MG tablet, Take 1 tablet (100 mg total) by mouth once as needed for up to 1 dose for migraine. May repeat in 2 hours if headache persists or recurs., Disp: 9 tablet, Rfl: 11  Social History   Tobacco Use  Smoking Status Never  Smokeless Tobacco Never    No Known Allergies Objective:  There were no vitals filed for this visit. There is no height or weight on file to calculate BMI. Constitutional Well developed. Well nourished.  Vascular Dorsalis pedis pulses palpable bilaterally. Posterior tibial pulses palpable bilaterally. Capillary refill normal to all digits.  No cyanosis or clubbing noted. Pedal hair growth normal.  Neurologic Normal speech. Oriented to person, place, and time. Epicritic sensation to light touch grossly present bilaterally.  Dermatologic Nails well groomed and normal in appearance. No open wounds. No skin lesions.  Orthopedic: Left ankle sprain with inversion injury no underlying fracture clinically appreciated.  No ecchymosis noted.  Pain on palpation at the ATFL ligament no pain at the calcaneofibular or posterior ligament.  No pain at the posterior tibial tendon peroneal tendon Achilles tendon   Radiographs: 3 views of skeletally mature the  left ankle: No fracture noted.  Other bony abnormalities identified Assessment:   1. Contusion of left ankle, initial encounter    Plan:  Patient was evaluated and treated and all questions answered.  Left ankle sprain -All questions and concerns were discussed with the patient in extensive detail. -Given the amount of pain that she is experiencing she will benefit from cam boot immobilization she will buy herself a cam boot and will place herself in it for next 4 weeks -If she is still hurting she will come back  and see me with possible transition to ankle brace.   No follow-ups on file.

## 2023-07-29 ENCOUNTER — Ambulatory Visit (INDEPENDENT_AMBULATORY_CARE_PROVIDER_SITE_OTHER): Payer: BC Managed Care – PPO | Admitting: Podiatry

## 2023-07-29 DIAGNOSIS — S9002XA Contusion of left ankle, initial encounter: Secondary | ICD-10-CM | POA: Diagnosis not present

## 2023-07-29 NOTE — Progress Notes (Signed)
Subjective:  Patient ID: Leah Hahn, female    DOB: [birth year],  MRN: [id number]  No chief complaint on file.   32 y.o. female presents with the above complaint.  Patient presents with complaint left ankle inversion injury.  She says she is doing a lot better cam boot helped considerably.  She would like to discuss next treatment plan.   Review of Systems: Negative except as noted in the HPI. Denies N/V/F/Ch.  Past Medical History:  Diagnosis Date   Migraine without aura 03/01/2014    Current Outpatient Medications:    butalbital-acetaminophen-caffeine (FIORICET) 50-325-40 MG tablet, Take 1-2 tablets by mouth every 4 (four) hours as needed for headache., Disp: 30 tablet, Rfl: 1   ferrous sulfate 325 (65 FE) MG EC tablet, Take 325 mg by mouth 3 (three) times daily with meals. (Patient not taking: Reported on 08/04/2020), Disp: , Rfl:    ibuprofen (ADVIL) 600 MG tablet, Take 1 tablet (600 mg total) by mouth every 6 (six) hours., Disp: 30 tablet, Rfl: 0   metoCLOPramide (REGLAN) 10 MG tablet, Take 1 tablet (10 mg total) by mouth every 8 (eight) hours as needed for nausea., Disp: 30 tablet, Rfl: 1   naproxen (NAPROSYN) 500 MG tablet, Take 1 tablet (500 mg total) by mouth 2 (two) times daily with a meal. As needed for pain, Disp: 60 tablet, Rfl: 2   norethindrone (MICRONOR) 0.35 MG tablet, Take 1 tablet (0.35 mg total) by mouth daily. (Patient not taking: Reported on 08/04/2020), Disp: 1 Package, Rfl: 11   prenatal vitamin w/FE, FA (PRENATAL 1 + 1) 27-1 MG TABS tablet, Take 1 tablet by mouth daily at 12 noon., Disp: , Rfl:    sertraline (ZOLOFT) 100 MG tablet, Take 1 tablet (100 mg total) by mouth daily., Disp: 30 tablet, Rfl: 2   sertraline (ZOLOFT) 50 MG tablet, Take 1 tablet (50 mg total) by mouth daily. Combine with 100mg  tab qday for total 150mg  po qday dose in one week PRN (Patient not taking: Reported on 08/04/2020), Disp: 30 tablet, Rfl: 1   SUMAtriptan (IMITREX) 100 MG  tablet, Take 1 tablet (100 mg total) by mouth once as needed for up to 1 dose for migraine. May repeat in 2 hours if headache persists or recurs., Disp: 9 tablet, Rfl: 11  Social History   Tobacco Use  Smoking Status Never  Smokeless Tobacco Never    No Known Allergies Objective:  There were no vitals filed for this visit. There is no height or weight on file to calculate BMI. Constitutional Well developed. Well nourished.  Vascular Dorsalis pedis pulses palpable bilaterally. Posterior tibial pulses palpable bilaterally. Capillary refill normal to all digits.  No cyanosis or clubbing noted. Pedal hair growth normal.  Neurologic Normal speech. Oriented to person, place, and time. Epicritic sensation to light touch grossly present bilaterally.  Dermatologic Nails well groomed and normal in appearance. No open wounds. No skin lesions.  Orthopedic: Left ankle sprain with inversion injury no underlying fracture clinically appreciated.  No ecchymosis noted.  No further there pain on palpation at the ATFL ligament no pain at the calcaneofibular or posterior ligament.  No pain at the posterior tibial tendon peroneal tendon Achilles tendon   Radiographs: 3 views of skeletally mature the left ankle: No fracture noted.  Other bony abnormalities identified Assessment:   No diagnosis found.  Plan:  Patient was evaluated and treated and all questions answered.  Left ankle sprain -All questions and concerns were discussed with the patient  in extensive detail. -Clinically healed and officially discharged from my care.  I encouraged her to transition out of boot into a Tri-Lock ankle brace.  She states understanding will do so immediately.  If any foot and ankle issues arise in the future she will come back and see me.   No follow-ups on file.

## 2024-03-30 ENCOUNTER — Ambulatory Visit (INDEPENDENT_AMBULATORY_CARE_PROVIDER_SITE_OTHER): Admitting: Obstetrics and Gynecology

## 2024-03-30 ENCOUNTER — Encounter: Payer: Self-pay | Admitting: Obstetrics and Gynecology

## 2024-03-30 VITALS — BP 147/84 | HR 88 | Ht 68.0 in | Wt 190.0 lb

## 2024-03-30 DIAGNOSIS — N939 Abnormal uterine and vaginal bleeding, unspecified: Secondary | ICD-10-CM | POA: Diagnosis not present

## 2024-03-30 DIAGNOSIS — N6452 Nipple discharge: Secondary | ICD-10-CM

## 2024-03-30 DIAGNOSIS — R030 Elevated blood-pressure reading, without diagnosis of hypertension: Secondary | ICD-10-CM | POA: Diagnosis not present

## 2024-03-30 MED ORDER — SLYND 4 MG PO TABS: 1.0000 | ORAL_TABLET | Freq: Every day | ORAL | 11 refills | Status: DC

## 2024-03-30 MED ORDER — SLYND 4 MG PO TABS: 1.0000 | ORAL_TABLET | Freq: Every day | ORAL | 11 refills | Status: AC

## 2024-03-30 NOTE — Progress Notes (Signed)
 New GYN  LMP:03/29/24 Monthly , Heavy flow Last pap:04/20/20 Hx of Abnormal and Colo Contraception: None  STD Screening: Declined   CC: since having last child heavy periods, associated w/ large clots size of hand. Leaking from breast has not breast fed in 2 yrs , no blood ,no pain pt states this happen in first pregnancy as well.

## 2024-03-30 NOTE — Progress Notes (Signed)
   NEW GYNECOLOGY VISIT  Subjective:  Leah Hahn is a 33 y.o. [id number] with LMP 03/29/24 presenting for heavy periods and nipple discharge  Reports regular, heavy periods. Passes clots. Last 5 days. Has been like this for a couple years. Has pain when passing clots.   Also notes breast discharge that looks like breast milk. Stopped breastfeeding 2 years ago. It is sometimes spontaneous, but she notes it more often when trying to see if she can express anything in the shower around once per week. Denies any other nipple stimulation. No pain or bloody discharge. More noticeable from right breast. This happened after her first pregnancy. Had normal b/l breast ultrasound (2018) and normal prolactin x2.   Significant life stressors, may be getting divorced.   I personally reviewed the following from 08/21/23: - CBC Hgb 13 - CMP normal - Vitamin B12 213 (low) - TSH 1.109 - A1c 5.1  Last pap NILM/HPV neg 04/20/20  Current medications: effexor, adderall prn  Objective:   Vitals:   03/30/24 1352  BP: (!) 147/84  Pulse: 88  Weight: 190 lb (86.2 kg)  Height: 5' 8 (1.727 m)   General:  Alert, oriented and cooperative. Patient is in no acute distress.  Skin: Skin is warm and dry. No rash noted.   Cardiovascular: Normal heart rate noted  Respiratory: Normal respiratory effort, no problems with respiration noted  Abdomen: Soft, non-tender, non-distended   Pelvic: Deferred  Exam performed in the presence of a chaperone. Normal breast exam. No discharge could be expressed. No mass, tenderness or skin changes, no axillary lymph nodes.   Assessment and Plan:  Leah Hahn is a 33 y.o. with AUB (menorrhagia), nipple discharge, and elevated BP reading  Abnormal uterine bleeding (AUB) Normal CBC/TSH 08/2023 and symptoms have not changed since then. Pap up to date, due next year.  Offered pelvic US , but reviewed that empiric treatment reasonable without red flag  symptoms. She would like to try empiric treatment.  We reviewed management options including TXA, POPs, Depo, IUD. Would avoid estrogen containing methods given elevated BP today. She would like to try POP/Slynd. If insurance won't cover slynd, will try mini pill or aygestin .  -     Drospirenone (SLYND) 4 MG TABS; Take 1 tablet (4 mg total) by mouth daily.  Nipple discharge Suspect physiologic nipple discharge - may be related to nipple stimulation or medication use. Recent normal CMP & TSH. Will r/o hyperprolactinemia as a cause.  Advised to stop hand expression in shower to see if episodes of spontaneous discharge stop -     Prolactin  Elevated blood pressure reading PCP follow up  Return for pending lab results, response to [facility].  Izell Marsh, MD

## 2024-03-31 ENCOUNTER — Ambulatory Visit: Payer: Self-pay | Admitting: Obstetrics and Gynecology

## 2024-03-31 LAB — PROLACTIN: Prolactin: 16.9 ng/mL (ref 4.8–33.4)

## 2024-04-26 ENCOUNTER — Ambulatory Visit
Admission: EM | Admit: 2024-04-26 | Discharge: 2024-04-26 | Disposition: A | Discharge status: Home / Self Care | Attending: Emergency Medicine | Admitting: Emergency Medicine

## 2024-04-26 ENCOUNTER — Encounter: Payer: Self-pay | Admitting: Emergency Medicine

## 2024-04-26 DIAGNOSIS — T2111XA Burn of first degree of chest wall, initial encounter: Secondary | ICD-10-CM | POA: Diagnosis not present

## 2024-04-26 DIAGNOSIS — T2022XA Burn of second degree of lip(s), initial encounter: Secondary | ICD-10-CM | POA: Diagnosis not present

## 2024-04-26 MED ORDER — SILVER SULFADIAZINE 1 % EX CREA: 1.0000 | TOPICAL_CREAM | Freq: Every day | CUTANEOUS | 0 refills | Status: AC
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
